# Patient Record
Sex: Female | Born: 1950
Health system: Southern US, Community
[De-identification: ages and names within clinical notes are randomized; demographics above are authoritative.]

## PROBLEM LIST (undated history)

## (undated) DIAGNOSIS — I10 Essential (primary) hypertension: Secondary | ICD-10-CM

## (undated) HISTORY — DX: Essential (primary) hypertension: I10

## (undated) HISTORY — PX: OOPHORECTOMY: SHX86

## (undated) HISTORY — PX: HEMORROIDECTOMY: SUR656

## (undated) HISTORY — PX: GASTRIC BYPASS: SHX52

## (undated) HISTORY — PX: APPENDECTOMY: SHX54

## (undated) HISTORY — PX: OVARIAN CYST SURGERY: SHX726

---

## 1998-01-06 ENCOUNTER — Other Ambulatory Visit: Admission: RE | Admit: 1998-01-06 | Discharge: 1998-01-06 | Payer: Self-pay | Admitting: Obstetrics and Gynecology

## 1999-03-31 ENCOUNTER — Other Ambulatory Visit: Admission: RE | Admit: 1999-03-31 | Discharge: 1999-03-31 | Payer: Self-pay | Admitting: Obstetrics and Gynecology

## 2000-02-26 ENCOUNTER — Emergency Department (HOSPITAL_COMMUNITY): Admission: EM | Admit: 2000-02-26 | Discharge: 2000-02-27 | Payer: Self-pay | Admitting: Emergency Medicine

## 2000-03-12 ENCOUNTER — Encounter: Payer: Self-pay | Admitting: General Surgery

## 2000-03-12 ENCOUNTER — Encounter: Admission: RE | Admit: 2000-03-12 | Discharge: 2000-03-12 | Payer: Self-pay | Admitting: General Surgery

## 2000-03-14 ENCOUNTER — Ambulatory Visit (HOSPITAL_BASED_OUTPATIENT_CLINIC_OR_DEPARTMENT_OTHER): Admission: RE | Admit: 2000-03-14 | Discharge: 2000-03-14 | Payer: Self-pay | Admitting: General Surgery

## 2000-03-14 ENCOUNTER — Encounter (INDEPENDENT_AMBULATORY_CARE_PROVIDER_SITE_OTHER): Payer: Self-pay | Admitting: *Deleted

## 2000-03-17 ENCOUNTER — Emergency Department (HOSPITAL_COMMUNITY): Admission: EM | Admit: 2000-03-17 | Discharge: 2000-03-17 | Payer: Self-pay | Admitting: Emergency Medicine

## 2002-01-15 ENCOUNTER — Other Ambulatory Visit: Admission: RE | Admit: 2002-01-15 | Discharge: 2002-01-15 | Payer: Self-pay | Admitting: Internal Medicine

## 2003-07-21 ENCOUNTER — Encounter: Admission: RE | Admit: 2003-07-21 | Discharge: 2003-07-21 | Payer: Self-pay | Admitting: Internal Medicine

## 2004-07-21 ENCOUNTER — Encounter: Admission: RE | Admit: 2004-07-21 | Discharge: 2004-07-21 | Payer: Self-pay | Admitting: Internal Medicine

## 2006-06-25 ENCOUNTER — Encounter: Admission: RE | Admit: 2006-06-25 | Discharge: 2006-06-25 | Payer: Self-pay | Admitting: Internal Medicine

## 2006-06-28 ENCOUNTER — Other Ambulatory Visit: Admission: RE | Admit: 2006-06-28 | Discharge: 2006-06-28 | Payer: Self-pay | Admitting: Internal Medicine

## 2007-06-27 ENCOUNTER — Encounter: Admission: RE | Admit: 2007-06-27 | Discharge: 2007-06-27 | Payer: Self-pay | Admitting: *Deleted

## 2008-06-30 ENCOUNTER — Encounter: Admission: RE | Admit: 2008-06-30 | Discharge: 2008-06-30 | Payer: Self-pay | Admitting: *Deleted

## 2009-07-01 ENCOUNTER — Encounter: Admission: RE | Admit: 2009-07-01 | Discharge: 2009-07-01 | Payer: Self-pay | Admitting: Family Medicine

## 2010-07-04 ENCOUNTER — Encounter: Admission: RE | Admit: 2010-07-04 | Discharge: 2010-07-04 | Payer: Self-pay | Admitting: *Deleted

## 2011-01-23 ENCOUNTER — Other Ambulatory Visit: Payer: Self-pay | Admitting: Physician Assistant

## 2011-01-23 ENCOUNTER — Other Ambulatory Visit (HOSPITAL_COMMUNITY)
Admission: RE | Admit: 2011-01-23 | Discharge: 2011-01-23 | Disposition: A | Discharge status: Home / Self Care | Payer: Managed Care, Other (non HMO) | Source: Ambulatory Visit | Attending: Internal Medicine | Admitting: Internal Medicine

## 2011-01-23 DIAGNOSIS — Z01419 Encounter for gynecological examination (general) (routine) without abnormal findings: Secondary | ICD-10-CM | POA: Insufficient documentation

## 2011-02-10 NOTE — Op Note (Signed)
[place]. [facility]  Patient:    Desiree James, Desiree James                    MRN: [id number] Proc. Date: 03/14/00 Adm. Date:  [id number] Disc. Date: [id number] Attending:  Cherylynn Ridges                           Operative Report  PREOPERATIVE DIAGNOSIS:  Severe internal and external hemorrhoids with 12 oclock large external skin tags.  POSTOPERATIVE DIAGNOSIS:  Severe internal and external hemorrhoids with 12 oclock large external skin tags.  PROCEDURE:  Internal and external hemorrhoidectomy x 3.  SURGEON:  Jimmye Norman, M.D.  ASSISTANT:  None.  ANESTHESIA:  General with a laryngeal airway and 0.25% Marcaine with epinephrine.  ESTIMATED BLOOD LOSS:  50 cc.  COMPLICATIONS:  None.  CONDITION:  Stable.  INDICATION FOR OPERATION:  The patient is a 60 year old with severe hemorrhoids that have been very symptomatic recently, who comes in now for internal and external hemorrhoidectomy.  FINDINGS:  The patient had large hemorrhoids internal and external at the 12 oclock position, approximately the 9 oclock position and the 4 oclock position with 12 oclock being directly anterior towards the pubis.  There was no thrombosis.  OPERATION:  The patient was taken to the operating room, placed on table in supine position.  After an adequate general laryngeal airway anesthetic was administered, she was prepped and draped in usual sterile manner in the lithotomy position.  Her gluteal cheeks were spread apart using benzoin spray and adhesive tape. Initially, the hemorrhoids at the 9 oclock position and the 4 oclock position were excised.  Marcaine 0.25% with epinephrine was injected into the submucosal plane.  The base of the hemorrhoid at the 9 oclock position was grasped using an Allis clamp and a 3-0 chromic suture placed at the base using a simple tie.  We subsequently excised the hemorrhoid with 15 blade using electrocautery to attain hemostasis in the  mucosal bed.   We ran the chromic stitch from the base, where it had been placed in a locking manner out to the skin level, where there was redundant tissue on the right side or at the 9 oclock position.  This additional tissue was excised and then a new suture using 3-0 chromic locking suture.  Once this was done, hemorrhoids at both the 4 oclock position and the 12 oclock position were removed in a similar manner attaining hemostasis using the locking stitches of 3-0 chromic and electrocautery.  All three were sent as specimens.  The patient still had a sphincter left intact, which could be seen circumferentially.  We placed a Gelfoam with dibucaine ointment into the rectum at the area of the excision of the hemorrhoids and then covered it with a sterile dressing.  All needle counts, sponge counts and instrument counts were correct. DD:  03/14/00 TD:  03/16/00 Job: [id number]

## 2011-07-03 ENCOUNTER — Other Ambulatory Visit: Payer: Self-pay | Admitting: Family Medicine

## 2011-07-03 DIAGNOSIS — Z1231 Encounter for screening mammogram for malignant neoplasm of breast: Secondary | ICD-10-CM

## 2011-07-10 ENCOUNTER — Ambulatory Visit
Admission: RE | Admit: 2011-07-10 | Discharge: 2011-07-10 | Disposition: A | Discharge status: Home / Self Care | Payer: Managed Care, Other (non HMO) | Source: Ambulatory Visit | Attending: Family Medicine | Admitting: Family Medicine

## 2011-07-10 DIAGNOSIS — Z1231 Encounter for screening mammogram for malignant neoplasm of breast: Secondary | ICD-10-CM

## 2012-07-18 ENCOUNTER — Other Ambulatory Visit: Payer: Self-pay | Admitting: Family Medicine

## 2012-07-18 DIAGNOSIS — Z1231 Encounter for screening mammogram for malignant neoplasm of breast: Secondary | ICD-10-CM

## 2012-07-22 ENCOUNTER — Ambulatory Visit
Admission: RE | Admit: 2012-07-22 | Discharge: 2012-07-22 | Disposition: A | Discharge status: Home / Self Care | Payer: No Typology Code available for payment source | Source: Ambulatory Visit | Attending: Family Medicine | Admitting: Family Medicine

## 2012-07-22 DIAGNOSIS — Z1231 Encounter for screening mammogram for malignant neoplasm of breast: Secondary | ICD-10-CM

## 2013-07-09 ENCOUNTER — Other Ambulatory Visit: Payer: Self-pay

## 2013-07-09 DIAGNOSIS — Z1231 Encounter for screening mammogram for malignant neoplasm of breast: Secondary | ICD-10-CM

## 2013-07-25 ENCOUNTER — Ambulatory Visit
Admission: RE | Admit: 2013-07-25 | Discharge: 2013-07-25 | Disposition: A | Discharge status: Home / Self Care | Payer: No Typology Code available for payment source | Source: Ambulatory Visit

## 2013-07-25 DIAGNOSIS — Z1231 Encounter for screening mammogram for malignant neoplasm of breast: Secondary | ICD-10-CM

## 2018-02-01 DIAGNOSIS — E668 Other obesity: Secondary | ICD-10-CM | POA: Diagnosis not present

## 2018-02-01 DIAGNOSIS — R002 Palpitations: Secondary | ICD-10-CM | POA: Diagnosis not present

## 2018-02-01 DIAGNOSIS — Z1389 Encounter for screening for other disorder: Secondary | ICD-10-CM | POA: Diagnosis not present

## 2018-02-01 DIAGNOSIS — M1990 Unspecified osteoarthritis, unspecified site: Secondary | ICD-10-CM | POA: Diagnosis not present

## 2018-02-01 DIAGNOSIS — Z980 Intestinal bypass and anastomosis status: Secondary | ICD-10-CM | POA: Diagnosis not present

## 2018-02-01 DIAGNOSIS — I10 Essential (primary) hypertension: Secondary | ICD-10-CM | POA: Diagnosis not present

## 2018-02-01 DIAGNOSIS — Z6838 Body mass index (BMI) 38.0-38.9, adult: Secondary | ICD-10-CM | POA: Diagnosis not present

## 2018-08-07 DIAGNOSIS — Z23 Encounter for immunization: Secondary | ICD-10-CM | POA: Diagnosis not present

## 2019-01-29 ENCOUNTER — Other Ambulatory Visit: Payer: Self-pay | Admitting: Endocrinology

## 2019-01-29 DIAGNOSIS — Z1231 Encounter for screening mammogram for malignant neoplasm of breast: Secondary | ICD-10-CM

## 2019-01-30 DIAGNOSIS — Z008 Encounter for other general examination: Secondary | ICD-10-CM | POA: Diagnosis not present

## 2019-02-04 DIAGNOSIS — I10 Essential (primary) hypertension: Secondary | ICD-10-CM | POA: Diagnosis not present

## 2019-02-05 DIAGNOSIS — R8290 Unspecified abnormal findings in urine: Secondary | ICD-10-CM | POA: Diagnosis not present

## 2019-02-05 DIAGNOSIS — I10 Essential (primary) hypertension: Secondary | ICD-10-CM | POA: Diagnosis not present

## 2019-02-11 DIAGNOSIS — R002 Palpitations: Secondary | ICD-10-CM | POA: Diagnosis not present

## 2019-02-11 DIAGNOSIS — I10 Essential (primary) hypertension: Secondary | ICD-10-CM | POA: Diagnosis not present

## 2019-02-11 DIAGNOSIS — E669 Obesity, unspecified: Secondary | ICD-10-CM | POA: Diagnosis not present

## 2019-02-11 DIAGNOSIS — Z980 Intestinal bypass and anastomosis status: Secondary | ICD-10-CM | POA: Diagnosis not present

## 2019-02-11 DIAGNOSIS — Z1331 Encounter for screening for depression: Secondary | ICD-10-CM | POA: Diagnosis not present

## 2019-02-11 DIAGNOSIS — Z1339 Encounter for screening examination for other mental health and behavioral disorders: Secondary | ICD-10-CM | POA: Diagnosis not present

## 2019-02-11 DIAGNOSIS — Z0000 Encounter for general adult medical examination without abnormal findings: Secondary | ICD-10-CM | POA: Diagnosis not present

## 2019-02-11 DIAGNOSIS — M1990 Unspecified osteoarthritis, unspecified site: Secondary | ICD-10-CM | POA: Diagnosis not present

## 2019-03-26 ENCOUNTER — Ambulatory Visit
Admission: RE | Admit: 2019-03-26 | Discharge: 2019-03-26 | Disposition: A | Discharge status: Home / Self Care | Payer: Medicare HMO | Source: Ambulatory Visit | Attending: Endocrinology | Admitting: Endocrinology

## 2019-03-26 ENCOUNTER — Other Ambulatory Visit: Payer: Self-pay

## 2019-03-26 DIAGNOSIS — Z1231 Encounter for screening mammogram for malignant neoplasm of breast: Secondary | ICD-10-CM | POA: Diagnosis not present

## 2019-04-12 DIAGNOSIS — R030 Elevated blood-pressure reading, without diagnosis of hypertension: Secondary | ICD-10-CM | POA: Diagnosis not present

## 2019-04-12 DIAGNOSIS — S6991XA Unspecified injury of right wrist, hand and finger(s), initial encounter: Secondary | ICD-10-CM | POA: Diagnosis not present

## 2019-06-15 DIAGNOSIS — R69 Illness, unspecified: Secondary | ICD-10-CM | POA: Diagnosis not present

## 2019-09-16 ENCOUNTER — Ambulatory Visit: Payer: Medicare HMO | Attending: Internal Medicine

## 2019-09-16 DIAGNOSIS — Z20822 Contact with and (suspected) exposure to covid-19: Secondary | ICD-10-CM

## 2019-09-16 DIAGNOSIS — Z20828 Contact with and (suspected) exposure to other viral communicable diseases: Secondary | ICD-10-CM | POA: Diagnosis not present

## 2019-09-18 LAB — NOVEL CORONAVIRUS, NAA: SARS-CoV-2, NAA: NOT DETECTED

## 2019-10-18 DIAGNOSIS — Z20828 Contact with and (suspected) exposure to other viral communicable diseases: Secondary | ICD-10-CM | POA: Diagnosis not present

## 2019-11-23 DIAGNOSIS — Z20828 Contact with and (suspected) exposure to other viral communicable diseases: Secondary | ICD-10-CM | POA: Diagnosis not present

## 2020-02-11 DIAGNOSIS — E669 Obesity, unspecified: Secondary | ICD-10-CM | POA: Diagnosis not present

## 2020-02-11 DIAGNOSIS — I10 Essential (primary) hypertension: Secondary | ICD-10-CM | POA: Diagnosis not present

## 2020-02-11 DIAGNOSIS — Z0000 Encounter for general adult medical examination without abnormal findings: Secondary | ICD-10-CM | POA: Diagnosis not present

## 2020-02-18 DIAGNOSIS — Z980 Intestinal bypass and anastomosis status: Secondary | ICD-10-CM | POA: Diagnosis not present

## 2020-02-18 DIAGNOSIS — E669 Obesity, unspecified: Secondary | ICD-10-CM | POA: Diagnosis not present

## 2020-02-18 DIAGNOSIS — R002 Palpitations: Secondary | ICD-10-CM | POA: Diagnosis not present

## 2020-02-18 DIAGNOSIS — M1990 Unspecified osteoarthritis, unspecified site: Secondary | ICD-10-CM | POA: Diagnosis not present

## 2020-02-18 DIAGNOSIS — I10 Essential (primary) hypertension: Secondary | ICD-10-CM | POA: Diagnosis not present

## 2020-02-18 DIAGNOSIS — Z1389 Encounter for screening for other disorder: Secondary | ICD-10-CM | POA: Diagnosis not present

## 2020-02-18 DIAGNOSIS — R69 Illness, unspecified: Secondary | ICD-10-CM | POA: Diagnosis not present

## 2020-02-20 DIAGNOSIS — R82998 Other abnormal findings in urine: Secondary | ICD-10-CM | POA: Diagnosis not present

## 2020-02-20 DIAGNOSIS — I10 Essential (primary) hypertension: Secondary | ICD-10-CM | POA: Diagnosis not present

## 2020-02-26 DIAGNOSIS — Z1212 Encounter for screening for malignant neoplasm of rectum: Secondary | ICD-10-CM | POA: Diagnosis not present

## 2020-03-20 DIAGNOSIS — M1990 Unspecified osteoarthritis, unspecified site: Secondary | ICD-10-CM | POA: Diagnosis not present

## 2020-03-20 DIAGNOSIS — Z823 Family history of stroke: Secondary | ICD-10-CM | POA: Diagnosis not present

## 2020-03-20 DIAGNOSIS — R030 Elevated blood-pressure reading, without diagnosis of hypertension: Secondary | ICD-10-CM | POA: Diagnosis not present

## 2020-03-20 DIAGNOSIS — Z6838 Body mass index (BMI) 38.0-38.9, adult: Secondary | ICD-10-CM | POA: Diagnosis not present

## 2020-03-20 DIAGNOSIS — Z833 Family history of diabetes mellitus: Secondary | ICD-10-CM | POA: Diagnosis not present

## 2020-03-20 DIAGNOSIS — R32 Unspecified urinary incontinence: Secondary | ICD-10-CM | POA: Diagnosis not present

## 2020-03-20 DIAGNOSIS — Z803 Family history of malignant neoplasm of breast: Secondary | ICD-10-CM | POA: Diagnosis not present

## 2020-03-20 DIAGNOSIS — E785 Hyperlipidemia, unspecified: Secondary | ICD-10-CM | POA: Diagnosis not present

## 2020-03-20 DIAGNOSIS — Z7982 Long term (current) use of aspirin: Secondary | ICD-10-CM | POA: Diagnosis not present

## 2020-03-30 DIAGNOSIS — Z1331 Encounter for screening for depression: Secondary | ICD-10-CM | POA: Diagnosis not present

## 2020-03-30 DIAGNOSIS — Z980 Intestinal bypass and anastomosis status: Secondary | ICD-10-CM | POA: Diagnosis not present

## 2020-03-30 DIAGNOSIS — I10 Essential (primary) hypertension: Secondary | ICD-10-CM | POA: Diagnosis not present

## 2020-03-30 DIAGNOSIS — R69 Illness, unspecified: Secondary | ICD-10-CM | POA: Diagnosis not present

## 2020-04-22 DIAGNOSIS — R69 Illness, unspecified: Secondary | ICD-10-CM | POA: Diagnosis not present

## 2020-06-25 ENCOUNTER — Other Ambulatory Visit: Payer: Self-pay | Admitting: Endocrinology

## 2020-06-25 DIAGNOSIS — Z1231 Encounter for screening mammogram for malignant neoplasm of breast: Secondary | ICD-10-CM

## 2020-08-23 ENCOUNTER — Ambulatory Visit
Admission: RE | Admit: 2020-08-23 | Discharge: 2020-08-23 | Disposition: A | Discharge status: Home / Self Care | Payer: Medicare HMO | Source: Ambulatory Visit | Attending: Endocrinology | Admitting: Endocrinology

## 2020-08-23 ENCOUNTER — Other Ambulatory Visit: Payer: Self-pay

## 2020-08-23 DIAGNOSIS — Z1231 Encounter for screening mammogram for malignant neoplasm of breast: Secondary | ICD-10-CM | POA: Diagnosis not present

## 2020-09-30 DIAGNOSIS — M1990 Unspecified osteoarthritis, unspecified site: Secondary | ICD-10-CM | POA: Diagnosis not present

## 2020-09-30 DIAGNOSIS — Z980 Intestinal bypass and anastomosis status: Secondary | ICD-10-CM | POA: Diagnosis not present

## 2020-09-30 DIAGNOSIS — E669 Obesity, unspecified: Secondary | ICD-10-CM | POA: Diagnosis not present

## 2020-09-30 DIAGNOSIS — R69 Illness, unspecified: Secondary | ICD-10-CM | POA: Diagnosis not present

## 2020-09-30 DIAGNOSIS — Z7689 Persons encountering health services in other specified circumstances: Secondary | ICD-10-CM | POA: Diagnosis not present

## 2020-09-30 DIAGNOSIS — I10 Essential (primary) hypertension: Secondary | ICD-10-CM | POA: Diagnosis not present

## 2020-09-30 DIAGNOSIS — R001 Bradycardia, unspecified: Secondary | ICD-10-CM | POA: Diagnosis not present

## 2020-10-06 ENCOUNTER — Other Ambulatory Visit (HOSPITAL_COMMUNITY): Payer: Self-pay | Admitting: Endocrinology

## 2020-10-06 DIAGNOSIS — R001 Bradycardia, unspecified: Secondary | ICD-10-CM

## 2020-10-28 ENCOUNTER — Other Ambulatory Visit: Payer: Self-pay

## 2020-10-28 ENCOUNTER — Ambulatory Visit (HOSPITAL_COMMUNITY): Payer: Medicare HMO | Attending: Cardiology

## 2020-10-28 DIAGNOSIS — R001 Bradycardia, unspecified: Secondary | ICD-10-CM

## 2020-10-28 LAB — ECHOCARDIOGRAM COMPLETE
Area-P 1/2: 3.3 cm2
S' Lateral: 2.9 cm

## 2021-02-11 DIAGNOSIS — I10 Essential (primary) hypertension: Secondary | ICD-10-CM | POA: Diagnosis not present

## 2021-02-16 ENCOUNTER — Encounter (INDEPENDENT_AMBULATORY_CARE_PROVIDER_SITE_OTHER): Payer: Self-pay

## 2021-02-18 DIAGNOSIS — K921 Melena: Secondary | ICD-10-CM | POA: Diagnosis not present

## 2021-02-18 DIAGNOSIS — Z0000 Encounter for general adult medical examination without abnormal findings: Secondary | ICD-10-CM | POA: Diagnosis not present

## 2021-02-18 DIAGNOSIS — I5189 Other ill-defined heart diseases: Secondary | ICD-10-CM | POA: Diagnosis not present

## 2021-02-18 DIAGNOSIS — E669 Obesity, unspecified: Secondary | ICD-10-CM | POA: Diagnosis not present

## 2021-02-18 DIAGNOSIS — Z980 Intestinal bypass and anastomosis status: Secondary | ICD-10-CM | POA: Diagnosis not present

## 2021-02-18 DIAGNOSIS — I10 Essential (primary) hypertension: Secondary | ICD-10-CM | POA: Diagnosis not present

## 2021-02-18 DIAGNOSIS — R69 Illness, unspecified: Secondary | ICD-10-CM | POA: Diagnosis not present

## 2021-02-18 DIAGNOSIS — R82998 Other abnormal findings in urine: Secondary | ICD-10-CM | POA: Diagnosis not present

## 2021-04-28 DIAGNOSIS — K921 Melena: Secondary | ICD-10-CM | POA: Diagnosis not present

## 2021-04-28 DIAGNOSIS — Z1212 Encounter for screening for malignant neoplasm of rectum: Secondary | ICD-10-CM | POA: Diagnosis not present

## 2021-05-06 DIAGNOSIS — Z9884 Bariatric surgery status: Secondary | ICD-10-CM | POA: Diagnosis not present

## 2021-05-06 DIAGNOSIS — R195 Other fecal abnormalities: Secondary | ICD-10-CM | POA: Diagnosis not present

## 2021-05-16 DIAGNOSIS — R195 Other fecal abnormalities: Secondary | ICD-10-CM | POA: Diagnosis not present

## 2021-06-29 DIAGNOSIS — I10 Essential (primary) hypertension: Secondary | ICD-10-CM | POA: Diagnosis not present

## 2021-06-29 DIAGNOSIS — E785 Hyperlipidemia, unspecified: Secondary | ICD-10-CM | POA: Diagnosis not present

## 2021-06-29 DIAGNOSIS — Z823 Family history of stroke: Secondary | ICD-10-CM | POA: Diagnosis not present

## 2021-06-29 DIAGNOSIS — Z008 Encounter for other general examination: Secondary | ICD-10-CM | POA: Diagnosis not present

## 2021-06-29 DIAGNOSIS — R69 Illness, unspecified: Secondary | ICD-10-CM | POA: Diagnosis not present

## 2021-06-29 DIAGNOSIS — F419 Anxiety disorder, unspecified: Secondary | ICD-10-CM | POA: Diagnosis not present

## 2021-06-29 DIAGNOSIS — R32 Unspecified urinary incontinence: Secondary | ICD-10-CM | POA: Diagnosis not present

## 2021-06-29 DIAGNOSIS — Z833 Family history of diabetes mellitus: Secondary | ICD-10-CM | POA: Diagnosis not present

## 2021-06-29 DIAGNOSIS — F325 Major depressive disorder, single episode, in full remission: Secondary | ICD-10-CM | POA: Diagnosis not present

## 2021-06-29 DIAGNOSIS — Z7982 Long term (current) use of aspirin: Secondary | ICD-10-CM | POA: Diagnosis not present

## 2021-06-29 DIAGNOSIS — Z803 Family history of malignant neoplasm of breast: Secondary | ICD-10-CM | POA: Diagnosis not present

## 2021-06-29 DIAGNOSIS — Z6838 Body mass index (BMI) 38.0-38.9, adult: Secondary | ICD-10-CM | POA: Diagnosis not present

## 2021-07-18 DIAGNOSIS — I10 Essential (primary) hypertension: Secondary | ICD-10-CM | POA: Diagnosis not present

## 2021-07-18 DIAGNOSIS — E669 Obesity, unspecified: Secondary | ICD-10-CM | POA: Diagnosis not present

## 2021-07-18 DIAGNOSIS — Z980 Intestinal bypass and anastomosis status: Secondary | ICD-10-CM | POA: Diagnosis not present

## 2021-07-18 DIAGNOSIS — I5189 Other ill-defined heart diseases: Secondary | ICD-10-CM | POA: Diagnosis not present

## 2021-07-18 DIAGNOSIS — R69 Illness, unspecified: Secondary | ICD-10-CM | POA: Diagnosis not present

## 2021-09-04 IMAGING — MG DIGITAL SCREENING BILAT W/ TOMO W/ CAD
8 series · 8 of 24 positions shown · non-contrast
Comparison: Previous exam(s).

CLINICAL DATA: Screening.

EXAM:
DIGITAL SCREENING BILATERAL MAMMOGRAM WITH TOMO AND CAD

[L CC synth-2D]
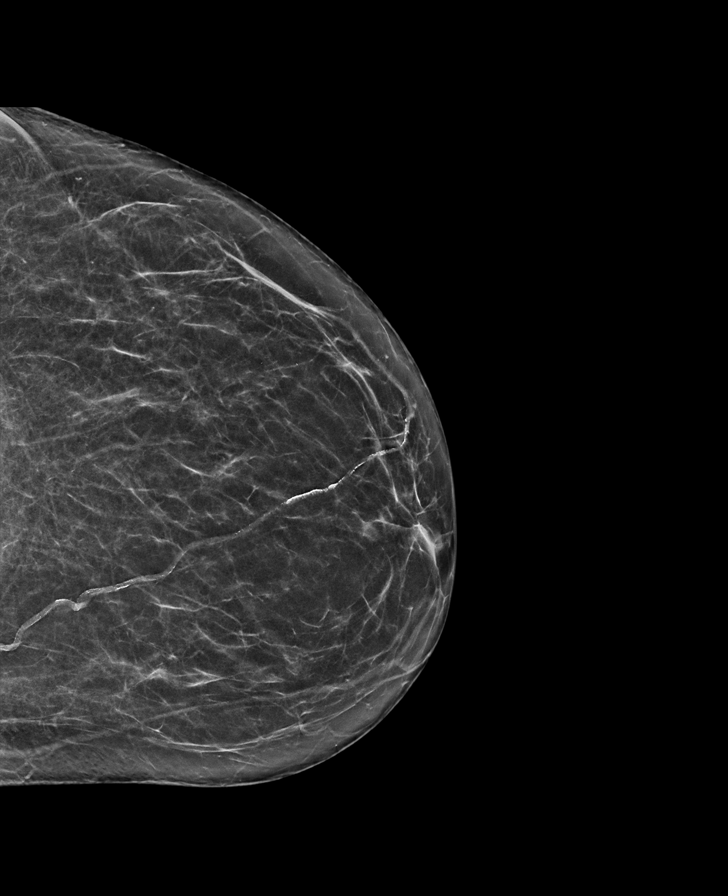

[L MLO synth-2D]
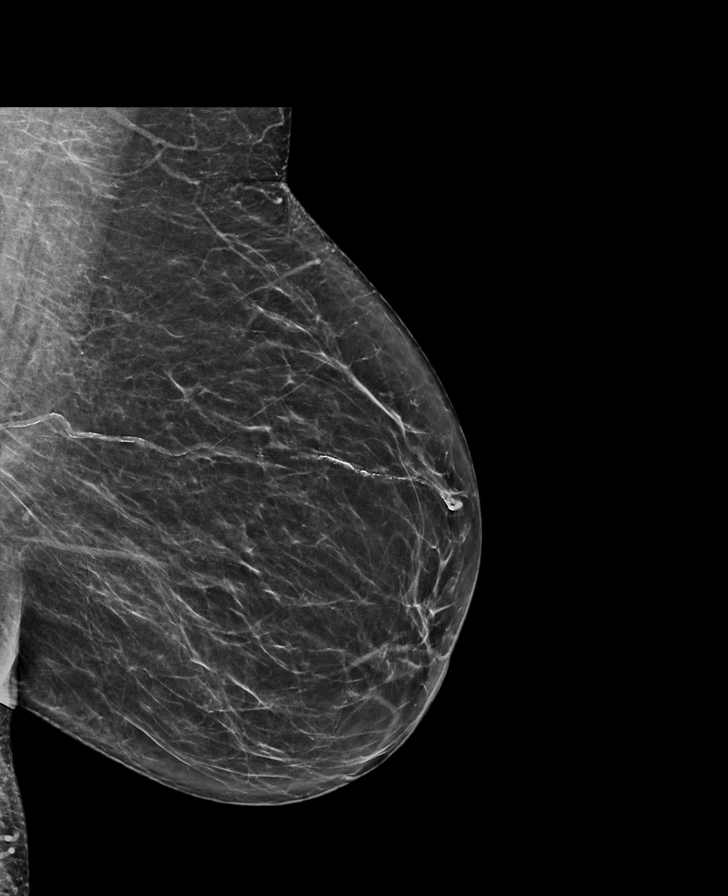

[R CC synth-2D]
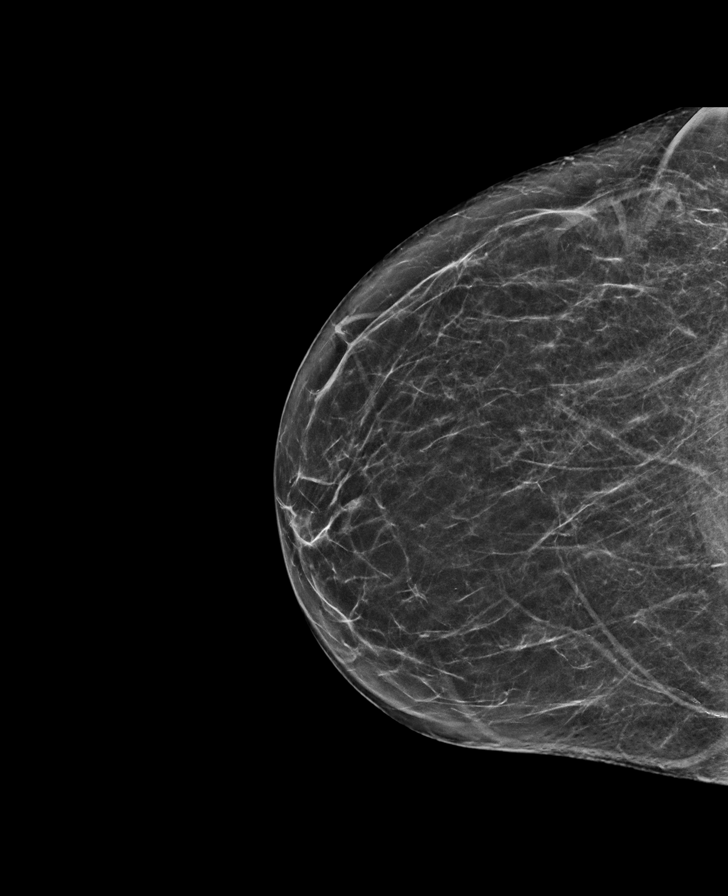

[R MLO synth-2D]
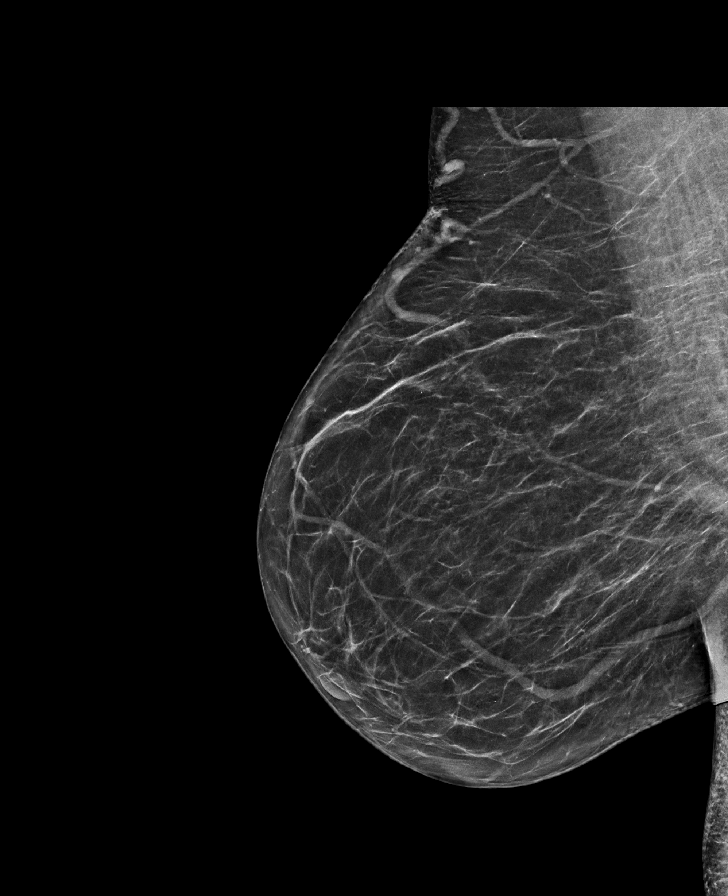

[L MLO tomo · tomo slice 35/69.0]
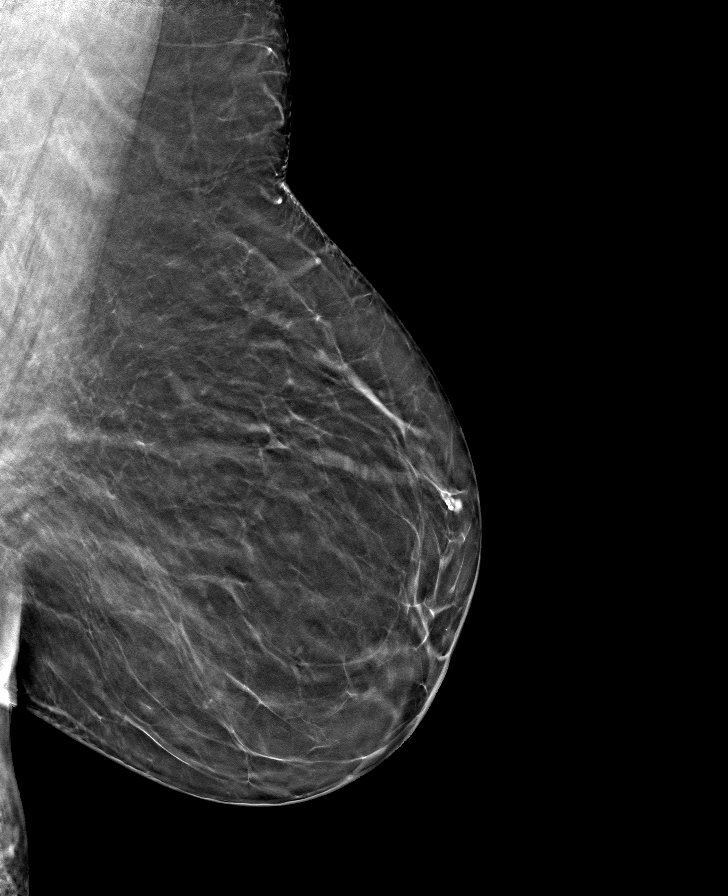

[R MLO tomo · tomo slice 35/68.0]
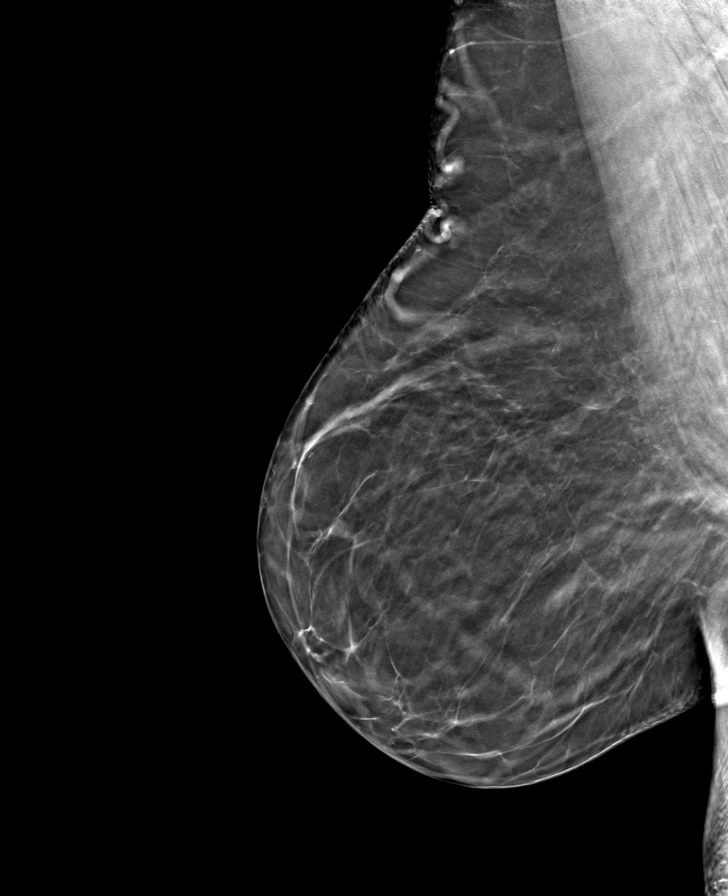

[L CC tomo · tomo slice 31/62.0]
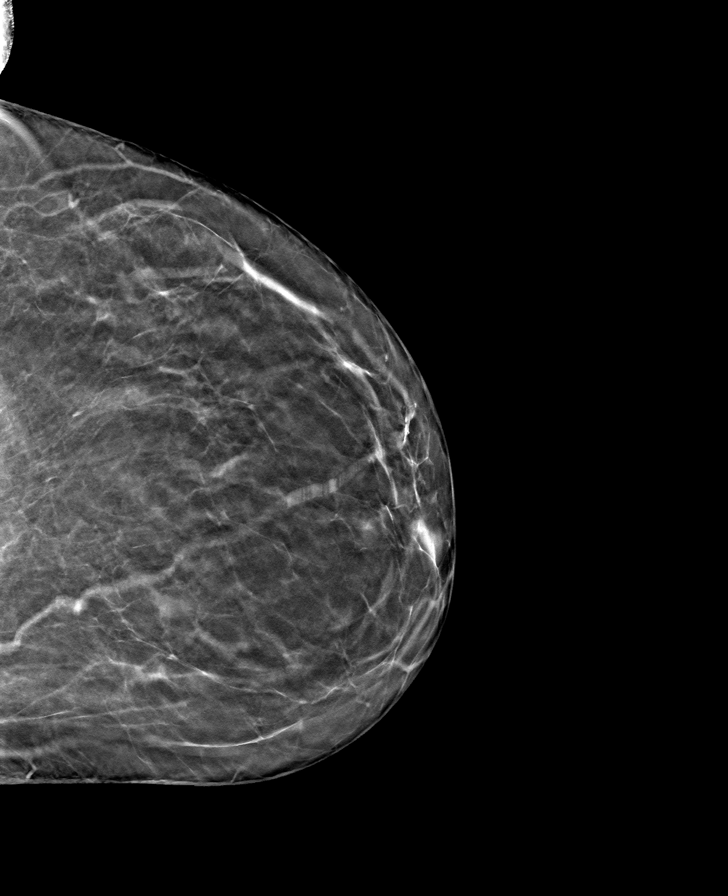

[R CC tomo · tomo slice 31/60.0]
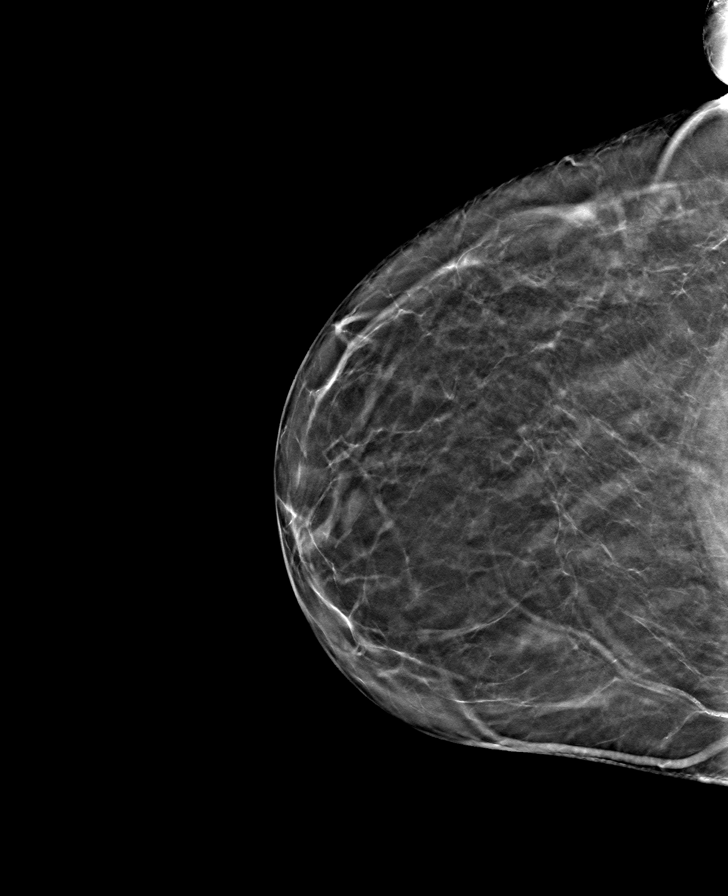

[8 of 24 positions shown; findings below may reference images not displayed]

ACR Breast Density Category b: There are scattered areas of
fibroglandular density.
FINDINGS: There are no findings suspicious for malignancy. Images were
processed with CAD.
IMPRESSION: No mammographic evidence of malignancy. A result letter of this
screening mammogram will be mailed directly to the patient.

RECOMMENDATION:
Screening mammogram in one year. (Code:[id number])

BI-RADS CATEGORY  1: Negative.

## 2021-09-26 ENCOUNTER — Encounter (INDEPENDENT_AMBULATORY_CARE_PROVIDER_SITE_OTHER): Payer: Self-pay

## 2022-01-24 DIAGNOSIS — I10 Essential (primary) hypertension: Secondary | ICD-10-CM | POA: Diagnosis not present

## 2022-01-24 DIAGNOSIS — Z0000 Encounter for general adult medical examination without abnormal findings: Secondary | ICD-10-CM | POA: Diagnosis not present

## 2022-01-24 DIAGNOSIS — R7989 Other specified abnormal findings of blood chemistry: Secondary | ICD-10-CM | POA: Diagnosis not present

## 2022-01-24 DIAGNOSIS — Z79899 Other long term (current) drug therapy: Secondary | ICD-10-CM | POA: Diagnosis not present

## 2022-01-31 DIAGNOSIS — F331 Major depressive disorder, recurrent, moderate: Secondary | ICD-10-CM | POA: Diagnosis not present

## 2022-01-31 DIAGNOSIS — R001 Bradycardia, unspecified: Secondary | ICD-10-CM | POA: Diagnosis not present

## 2022-01-31 DIAGNOSIS — E669 Obesity, unspecified: Secondary | ICD-10-CM | POA: Diagnosis not present

## 2022-01-31 DIAGNOSIS — Z1331 Encounter for screening for depression: Secondary | ICD-10-CM | POA: Diagnosis not present

## 2022-01-31 DIAGNOSIS — I5189 Other ill-defined heart diseases: Secondary | ICD-10-CM | POA: Diagnosis not present

## 2022-01-31 DIAGNOSIS — F418 Other specified anxiety disorders: Secondary | ICD-10-CM | POA: Diagnosis not present

## 2022-01-31 DIAGNOSIS — I10 Essential (primary) hypertension: Secondary | ICD-10-CM | POA: Diagnosis not present

## 2022-01-31 DIAGNOSIS — Z0000 Encounter for general adult medical examination without abnormal findings: Secondary | ICD-10-CM | POA: Diagnosis not present

## 2022-01-31 DIAGNOSIS — R82998 Other abnormal findings in urine: Secondary | ICD-10-CM | POA: Diagnosis not present

## 2022-01-31 DIAGNOSIS — Z980 Intestinal bypass and anastomosis status: Secondary | ICD-10-CM | POA: Diagnosis not present

## 2022-02-01 ENCOUNTER — Encounter: Payer: Self-pay | Admitting: Student

## 2022-02-01 ENCOUNTER — Ambulatory Visit: Payer: Medicare HMO | Admitting: Student

## 2022-02-01 ENCOUNTER — Inpatient Hospital Stay: Payer: Medicare HMO

## 2022-02-01 VITALS — BP 145/78 | HR 39 | Temp 98.1°F | Resp 17 | Ht 64.0 in | Wt 228.8 lb

## 2022-02-01 DIAGNOSIS — R0609 Other forms of dyspnea: Secondary | ICD-10-CM | POA: Diagnosis not present

## 2022-02-01 DIAGNOSIS — R5383 Other fatigue: Secondary | ICD-10-CM

## 2022-02-01 DIAGNOSIS — R001 Bradycardia, unspecified: Secondary | ICD-10-CM | POA: Diagnosis not present

## 2022-02-01 DIAGNOSIS — R0683 Snoring: Secondary | ICD-10-CM | POA: Diagnosis not present

## 2022-02-01 DIAGNOSIS — I495 Sick sinus syndrome: Secondary | ICD-10-CM

## 2022-02-01 DIAGNOSIS — I498 Other specified cardiac arrhythmias: Secondary | ICD-10-CM

## 2022-02-01 DIAGNOSIS — R9431 Abnormal electrocardiogram [ECG] [EKG]: Secondary | ICD-10-CM

## 2022-02-01 NOTE — Progress Notes (Signed)
? ?Primary Physician/Referring:  Adrian PrinceSouth, Stephen, MD ? ?Patient ID: Desiree BrochureMarilyn W James, female    DOB: 03/21/1951, 71 y.o.   MRN: [id number] ? ?Chief Complaint  ?Patient presents with  ? New Patient (Initial Visit)  ? Bradycardia  ? ?HPI:   ? ?Desiree James  is a 71 y.o. Caucasian female with history of hypertension, hyperlipidemia, anxiety.  Denies history of diabetes, tobacco use, MI, CVA/TIA.  Patient was originally referred to our office by PCP given marked bradycardia noted on PCP visit. ? ?Patient reports ongoing general fatigue since February 2023.  She does walk her dog 1-2 times daily for 15 to 20 minutes each time without issue.  She does note mild dyspnea on exertion when walking uphill and occasional episodes of lightheadedness when bending over or standing for long periods of time.  Denies palpitations, syncope, near syncope.  Denies orthopnea, PND.  Patient does snore.  She reports she was tested for sleep apnea several years ago and did not have sleep apnea at this time, however she notes this was a long time ago. ? ?Upon further questioning patient states that for the last few months she has been taking Ativan 0.5 grams twice daily along with sertraline, both of these medications are managed by her PCP Dr. Evlyn KannerSouth. ? ?Past Medical History:  ?Diagnosis Date  ? Hypertension   ? ?Past Surgical History:  ?Procedure Laterality Date  ? APPENDECTOMY    ? GASTRIC BYPASS    ? HEMORROIDECTOMY    ? OOPHORECTOMY    ? OVARIAN CYST SURGERY    ? ?Family History  ?Problem Relation Age of Onset  ? Stroke Mother   ? Stroke Father   ? Pneumonia Father   ? Breast cancer Sister   ? Pneumonia Brother   ? Cancer Brother   ?  ?Social History  ? ?Tobacco Use  ? Smoking status: Never  ? Smokeless tobacco: Never  ?Substance Use Topics  ? Alcohol use: Yes  ?  Comment: occ  ? ?Marital Status: Married  ? ?ROS  ?Review of Systems  ?Constitutional: Positive for malaise/fatigue. Negative for weight gain.  ?Cardiovascular:  Positive  for dyspnea on exertion. Negative for chest pain, claudication, leg swelling, near-syncope, orthopnea, palpitations, paroxysmal nocturnal dyspnea and syncope.  ?Respiratory:  Positive for snoring.   ? ?Objective  ?Blood pressure (!) 145/78, pulse (!) 39, temperature 98.1 ?F (36.7 ?C), temperature source Temporal, resp. rate 17, height 5\' 4"  (1.626 m), weight 228 lb 12.8 oz (103.8 kg), SpO2 95 %.  ? ?  02/01/2022  ?  9:22 AM  ?Vitals with BMI  ?Height 5\' 4"   ?Weight 228 lbs 13 oz  ?BMI 39.25  ?Systolic 145  ?Diastolic 78  ?Pulse 39  ?  ? ? Physical Exam ?Vitals reviewed.  ?Constitutional:   ?   Appearance: She is obese.  ?HENT:  ?   Head: Normocephalic and atraumatic.  ?Cardiovascular:  ?   Rate and Rhythm: Normal rate and regular rhythm.  ?   Pulses: Intact distal pulses.  ?   Heart sounds: S1 normal and S2 normal. No murmur heard. ?  No gallop.  ?Pulmonary:  ?   Effort: Pulmonary effort is normal. No respiratory distress.  ?   Breath sounds: Rhonchi (bilaterally) present. No wheezing or rales.  ?Musculoskeletal:  ?   Right lower leg: No edema.  ?   Left lower leg: No edema.  ?Neurological:  ?   Mental Status: She is alert.  ? ? ?Laboratory  examination:  ? ?No results for input(s): NA, K, CL, CO2, GLUCOSE, BUN, CREATININE, CALCIUM, GFRNONAA, GFRAA in the last 8760 hours. ?CrCl cannot be calculated (No successful lab value found.).  ?   ? View : No data to display.  ?  ?  ?  ? ?   ? View : No data to display.  ?  ?  ?  ? ? ?Lipid Panel ?No results for input(s): CHOL, TRIG, LDLCALC, VLDL, HDL, CHOLHDL, LDLDIRECT in the last 8760 hours. ? ?HEMOGLOBIN A1C ?No results found for: HGBA1C, MPG ?TSH ?No results for input(s): TSH in the last 8760 hours. ? ?External labs:  ?None - have requested PCP labs  ? ?Allergies  ? ?Allergies  ?Allergen Reactions  ? Morphine Rash  ?  ?Medications Prior to Visit:  ? ?Outpatient Medications Prior to Visit  ?Medication Sig Dispense Refill  ? aspirin 81 MG EC tablet Take 1 tablet by mouth  daily.    ? LORazepam (ATIVAN) 0.5 MG tablet Take 0.5 mg by mouth 2 (two) times daily as needed.    ? losartan (COZAAR) 50 MG tablet Take 50 mg by mouth 2 (two) times daily.    ? Multiple Vitamin (MULTI VITAMIN) TABS Take 1 tablet by mouth daily.    ? Omega-3 Fatty Acids (FISH OIL) 1000 MG CAPS Take 1 capsule by mouth daily.    ? rosuvastatin (CRESTOR) 10 MG tablet Take 10 mg by mouth 2 (two) times a week.    ? sertraline (ZOLOFT) 50 MG tablet Take 50 mg by mouth daily.    ? ?No facility-administered medications prior to visit.  ? ?Final Medications at End of Visit   ? ?Current Meds  ?Medication Sig  ? aspirin 81 MG EC tablet Take 1 tablet by mouth daily.  ? LORazepam (ATIVAN) 0.5 MG tablet Take 0.5 mg by mouth 2 (two) times daily as needed.  ? losartan (COZAAR) 50 MG tablet Take 50 mg by mouth 2 (two) times daily.  ? Multiple Vitamin (MULTI VITAMIN) TABS Take 1 tablet by mouth daily.  ? Omega-3 Fatty Acids (FISH OIL) 1000 MG CAPS Take 1 capsule by mouth daily.  ? rosuvastatin (CRESTOR) 10 MG tablet Take 10 mg by mouth 2 (two) times a week.  ? sertraline (ZOLOFT) 50 MG tablet Take 50 mg by mouth daily.  ? ?Radiology:  ? ?No results found. ? ?Cardiac Studies:  ? ?Echocardiogram 10/28/2020: ? 1. Left ventricular ejection fraction, by estimation, is 55 to 60%. Left  ?ventricular ejection fraction by 3D volume is 56 %. The left ventricle has  ?normal function. The left ventricle has no regional wall motion  ?abnormalities. Left ventricular diastolic  ? parameters are consistent with Grade II diastolic dysfunction  ?(pseudonormalization).  ? 2. Right ventricular systolic function is normal. The right ventricular  ?size is normal. There is normal pulmonary artery systolic pressure.  ? 3. Left atrial size was severely dilated.  ? 4. Right atrial size was mild to moderately dilated.  ? 5. The mitral valve is normal in structure. Mild mitral valve  ?regurgitation. No evidence of mitral stenosis.  ? 6. The aortic valve is  tricuspid. Aortic valve regurgitation is not  ?visualized. No aortic stenosis is present.  ? 7. Mildly dilated pulmonary artery.  ? 8. The inferior vena cava is normal in size with greater than 50%  ?respiratory variability, suggesting right atrial pressure of 3 mmHg.  ? ?EKG:  ? ?02/01/2022: Marked sinus bradycardia at a rate of 36  bpm.  Normal axis.  Poor R wave progression, cannot exclude anteroseptal infarct old.  Nonspecific T wave abnormality.  No evidence of ischemia or underlying injury pattern. ? ?Assessment  ? ?  ICD-10-CM   ?1. Tachycardia-bradycardia syndrome (HCC)  I49.5 EKG 12-Lead  ?  ?2. Abnormal electrocardiogram  R94.31 PCV MYOCARDIAL PERFUSION WO LEXISCAN  ?  ?3. Sinus bradycardia  R00.1 Ambulatory referral to Sleep Studies  ?  PCV ECHOCARDIOGRAM COMPLETE  ?  LONG TERM MONITOR (3-14 DAYS)  ?  ?4. Snoring  R06.83 Ambulatory referral to Sleep Studies  ?  ?5. Other fatigue  R53.83 Ambulatory referral to Sleep Studies  ?  LONG TERM MONITOR (3-14 DAYS)  ?  ?6. DOE (dyspnea on exertion)  R06.09 PCV ECHOCARDIOGRAM COMPLETE  ?  PCV MYOCARDIAL PERFUSION WO LEXISCAN  ?  ?  ? ?There are no discontinued medications.  ?No orders of the defined types were placed in this encounter. ? ? ?Recommendations:  ? ?BAILEA BEED is a 71 y.o.  Caucasian female with history of hypertension, hyperlipidemia, anxiety.  Denies history of diabetes, tobacco use, MI, CVA/TIA.  Patient was originally referred to our office by PCP given marked bradycardia noted on PCP visit. ? ?Patient is fairly asymptomatic in regard to bradycardia, with her only specific complaint being generalized fatigue over the last few months.  Patient walked in the hallway of our office with starting heart rate 39 bpm, ending heart rate 57 bpm, maximum heart rate 63 bpm.  Patient was asymptomatic during this walk she walks on a daily basis at home without issue.  Discussed at length with patient evaluating for reversible causes of bradycardia.  Patient  is presently taking Ativan twice daily every day, advised her to discuss with alternative anxiety management options as Ativan may be contributing to bradycardia. ? ?I have requested PCP labs, specifically thyr

## 2022-02-02 ENCOUNTER — Ambulatory Visit: Payer: Medicare HMO

## 2022-02-02 DIAGNOSIS — R001 Bradycardia, unspecified: Secondary | ICD-10-CM | POA: Diagnosis not present

## 2022-02-02 DIAGNOSIS — R0609 Other forms of dyspnea: Secondary | ICD-10-CM

## 2022-02-13 ENCOUNTER — Ambulatory Visit: Payer: Medicare HMO

## 2022-02-13 DIAGNOSIS — R9431 Abnormal electrocardiogram [ECG] [EKG]: Secondary | ICD-10-CM

## 2022-02-13 DIAGNOSIS — R001 Bradycardia, unspecified: Secondary | ICD-10-CM | POA: Diagnosis not present

## 2022-02-13 DIAGNOSIS — R5383 Other fatigue: Secondary | ICD-10-CM | POA: Diagnosis not present

## 2022-02-13 DIAGNOSIS — R0609 Other forms of dyspnea: Secondary | ICD-10-CM

## 2022-02-14 ENCOUNTER — Telehealth: Payer: Self-pay | Admitting: Student

## 2022-02-14 NOTE — Telephone Encounter (Signed)
[place] with [employer] calling to provide abnormal results for this patient. Please call her at [phone].

## 2022-02-14 NOTE — Telephone Encounter (Signed)
[place] with [employer] called back to give results.   Symptomatic bradycardia recorded on strip 2 @ 32 BPM.  Vtac and SVT on final strip as well as 2 episodes of pauses, with longest pause @ 3.6 seconds.

## 2022-02-15 DIAGNOSIS — R001 Bradycardia, unspecified: Secondary | ICD-10-CM | POA: Diagnosis not present

## 2022-02-15 DIAGNOSIS — R5383 Other fatigue: Secondary | ICD-10-CM | POA: Diagnosis not present

## 2022-02-15 NOTE — Progress Notes (Signed)
Reviewed and discussed results of monitor with patient.  She remains asymptomatic and in fact patient triggered events for accidental due to itching around the monitor site.  Patient's monitor findings are suggestive of tachybradycardia syndrome which we discussed at last office visit.  We will refer patient to EP for further evaluation as she is asymptomatic.

## 2022-02-15 NOTE — Addendum Note (Signed)
Addended by: Trula Ore on: 02/15/2022 02:54 PM   Modules accepted: Orders

## 2022-02-15 NOTE — Telephone Encounter (Signed)
I reviewed monitor and discussed results with patient.

## 2022-03-07 ENCOUNTER — Encounter: Payer: Self-pay | Admitting: Student

## 2022-03-14 NOTE — Progress Notes (Unsigned)
Primary Physician/Referring:  Adrian Prince, MD  Patient ID: Desiree James, female    DOB: 1951-07-20, 71 y.o.   MRN: [id number]  No chief complaint on file.  HPI:    Desiree James  is a 71 y.o. Caucasian female with history of hypertension, hyperlipidemia, anxiety.  Denies history of diabetes, tobacco use, MI, CVA/TIA.  Patient was originally referred to our office by PCP given marked bradycardia noted on PCP visit.  Patient was last seen in our office 02/01/2022 at which time ordered cardiac monitor and referred to sleep medicine, ordered echocardiogram and stress test. Echocardiogram revealed preserved LVEF, mild and moderate valvular disease as well as mild pulmonary hypertension. Stress test was low risk. Cardiac monitor finding suggestive of tachy-brady syndrome and patient was referred to EP for further recommendations.   Patient now presents for follow up. ***  ***  Patient reports ongoing general fatigue since February 2023.  She does walk her dog 1-2 times daily for 15 to 20 minutes each time without issue.  She does note mild dyspnea on exertion when walking uphill and occasional episodes of lightheadedness when bending over or standing for long periods of time.  Denies palpitations, syncope, near syncope.  Denies orthopnea, PND.  Patient does snore.  She reports she was tested for sleep apnea several years ago and did not have sleep apnea at this time, however she notes this was a long time ago.  Upon further questioning patient states that for the last few months she has been taking Ativan 0.5 grams twice daily along with sertraline, both of these medications are managed by her PCP Dr. Evlyn Kanner.  Past Medical History:  Diagnosis Date   Hypertension    Past Surgical History:  Procedure Laterality Date   APPENDECTOMY     GASTRIC BYPASS     HEMORROIDECTOMY     OOPHORECTOMY     OVARIAN CYST SURGERY     Family History  Problem Relation Age of Onset   Stroke Mother     Stroke Father    Pneumonia Father    Breast cancer Sister    Pneumonia Brother    Cancer Brother     Social History   Tobacco Use   Smoking status: Never   Smokeless tobacco: Never  Substance Use Topics   Alcohol use: Yes    Comment: occ   Marital Status: Married   ROS  Review of Systems  Constitutional: Positive for malaise/fatigue. Negative for weight gain.  Cardiovascular:  Positive for dyspnea on exertion. Negative for chest pain, claudication, leg swelling, near-syncope, orthopnea, palpitations, paroxysmal nocturnal dyspnea and syncope.  Respiratory:  Positive for snoring.     Objective  There were no vitals taken for this visit.     02/01/2022    9:22 AM  Vitals with BMI  Height 5\' 4"   Weight 228 lbs 13 oz  BMI 39.25  Systolic 145  Diastolic 78  Pulse 39      Physical Exam Vitals reviewed.  Constitutional:      Appearance: She is obese.  HENT:     Head: Normocephalic and atraumatic.  Cardiovascular:     Rate and Rhythm: Normal rate and regular rhythm.     Pulses: Intact distal pulses.     Heart sounds: S1 normal and S2 normal. No murmur heard.    No gallop.  Pulmonary:     Effort: Pulmonary effort is normal. No respiratory distress.     Breath sounds: Rhonchi (bilaterally) present. No  wheezing or rales.  Musculoskeletal:     Right lower leg: No edema.     Left lower leg: No edema.  Neurological:     Mental Status: She is alert.     Laboratory examination:   No results for input(s): "NA", "K", "CL", "CO2", "GLUCOSE", "BUN", "CREATININE", "CALCIUM", "GFRNONAA", "GFRAA" in the last 8760 hours. CrCl cannot be calculated (No successful lab value found.).      No data to display              No data to display           Lipid Panel No results for input(s): "CHOL", "TRIG", "LDLCALC", "VLDL", "HDL", "CHOLHDL", "LDLDIRECT" in the last 8760 hours.  HEMOGLOBIN A1C No results found for: "HGBA1C", "MPG" TSH No results for input(s): "TSH" in  the last 8760 hours.  External labs:  01/31/2022: Creatinine 0.7, GFR >60, sodium 141, potassium 4.5, AST 20, ALT 22, BUN 10 Hgb 12.6, HCT 36.4, MCV 82.1, platelet 204 Triglycerides 65, HDL 52, LDL 97, total cholesterol 623 TSH 2.62  Allergies   Allergies  Allergen Reactions   Morphine Rash    Medications Prior to Visit:   Outpatient Medications Prior to Visit  Medication Sig Dispense Refill   aspirin 81 MG EC tablet Take 1 tablet by mouth daily.     LORazepam (ATIVAN) 0.5 MG tablet Take 0.5 mg by mouth 2 (two) times daily as needed.     losartan (COZAAR) 50 MG tablet Take 50 mg by mouth 2 (two) times daily.     Multiple Vitamin (MULTI VITAMIN) TABS Take 1 tablet by mouth daily.     Omega-3 Fatty Acids (FISH OIL) 1000 MG CAPS Take 1 capsule by mouth daily.     rosuvastatin (CRESTOR) 10 MG tablet Take 10 mg by mouth 2 (two) times a week.     sertraline (ZOLOFT) 50 MG tablet Take 50 mg by mouth daily.     No facility-administered medications prior to visit.   Final Medications at End of Visit    No outpatient medications have been marked as taking for the 03/15/22 encounter (Appointment) with Carlynn Purl, Farheen Pfahler C, PA-C.   Radiology:   No results found.  Cardiac Studies:  Ambulatory cardiac telemetry 7 days (02/01/2022 - 02/08/2022): Predominant underlying rhythm was sinus.  Patient with 4 episodes of ventricular tachycardia with the longest lasting 11 beats.  These episodes were asymptomatic.  She also had 41 episodes of supraventricular tachycardia with the longest episode lasting 41 beats.  Junctional escape rhythm was present.  She had 2 sinus pauses 3.2 seconds and 3.6 seconds, both of which occurred during sleep hours and were asymptomatic.  Patient had occasional PACs and rare PVCs.  Both ventricular bigeminy and trigeminy were present.   Notably on report there were 5 patient triggered events.  However upon further discussion with patient these were accidental triggers due to  itching in the area around the monitor.  She denies episodes of dizziness, lightheadedness, dyspnea, chest pain, syncope, near syncope.  Lexiscan (with Mod Bruce protocol) Nuclear stress test 02/13/2022: Nondiagnostic ECG stress. The heart rate response was consistent with Lexiscan. Additionally, patient exercised on a Modified Bruce protocol. Baseline heart rate was 37 bpm. A maximum heart rate of 113 beats per minute was achieved, which is 75% of the maximum predicted heart rate response. No symptoms.  Myocardial perfusion is normal. Overall LV systolic function is normal without regional wall motion abnormalities. Stress LV EF: 62%.  No previous  exam available for comparison. Low risk study.   Echocardiogram 02/02/2022:  Normal LV systolic function with EF 55%. Left ventricle cavity is normal  in size. Normal global wall motion. Normal diastolic filling pattern.  Calculated EF 55%.  Left atrial cavity is moderately dilated at 4.6 cm.  Structurally normal mitral valve.  Mild (Grade I) mitral regurgitation.  Structurally normal tricuspid valve.  Moderate tricuspid regurgitation.  Mild pulmonary hypertension. RVSP measures 34 mmHg.  Structurally normal pulmonic valve.  Moderate pulmonic regurgitation.  Marked sinus bradycardia throughout the exam.  EKG:   02/01/2022: Marked sinus bradycardia at a rate of 36 bpm.  Normal axis.  Poor R wave progression, cannot exclude anteroseptal infarct old.  Nonspecific T wave abnormality.  No evidence of ischemia or underlying injury pattern.  Assessment   No diagnosis found.    There are no discontinued medications.  No orders of the defined types were placed in this encounter.   Recommendations:   Desiree James is a 71 y.o.  Caucasian female with history of hypertension, hyperlipidemia, anxiety.  Denies history of diabetes, tobacco use, MI, CVA/TIA.  Patient was originally referred to our office by PCP given marked bradycardia noted on PCP  visit.  Patient was last seen in our office 02/01/2022 at which time ordered cardiac monitor and referred to sleep medicine, ordered echocardiogram and stress test. Echocardiogram revealed preserved LVEF, mild and moderate valvular disease as well as mild pulmonary hypertension. Stress test was low risk. Cardiac monitor finding suggestive of tachy-brady syndrome and patient was referred to EP for further recommendations.   Patient now presents for follow up. ***  ***  Patient is fairly asymptomatic in regard to bradycardia, with her only specific complaint being generalized fatigue over the last few months.  Patient walked in the hallway of our office with starting heart rate 39 bpm, ending heart rate 57 bpm, maximum heart rate 63 bpm.  Patient was asymptomatic during this walk she walks on a daily basis at home without issue.  Discussed at length with patient evaluating for reversible causes of bradycardia.  Patient is presently taking Ativan twice daily every day, advised her to discuss with alternative anxiety management options as Ativan may be contributing to bradycardia.  I have requested PCP labs, specifically thyroid function testing for review.  Patient is not presently on AV nodal blocking agents.  She does snore, and with complaints of fatigue, would recommend repeat sleep evaluation.  We will refer patient to sleep medicine for further recommendations.   She demonstrates chronotropic competence and is relatively asymptomatic, therefore no indication for pacemaker or hospitalization at this time.  However counseled patient at length regarding signs and symptoms that would warrant urgent or emergent evaluation, she verbalized understanding and agreement as did her husband.  From a cardiac standpoint we will obtain ambulatory telemetry to evaluate for underlying significant arrhythmias.  We will also obtain echocardiogram.  Given cardiovascular risk factors including obesity, hypertension,  hyperlipidemia we will obtain nuclear treadmill stress test.   Patient's blood pressure is elevated above goal in the office today, however given bradycardia will not make changes at this time.  Follow-up in 6 weeks, sooner if needed.   Alethia Berthold, PA-C 03/14/2022, 12:08 PM Office: [phone]  CC: Dr. Reynold Bowen

## 2022-03-15 ENCOUNTER — Encounter: Payer: Self-pay | Admitting: Student

## 2022-03-15 ENCOUNTER — Ambulatory Visit: Payer: Medicare HMO | Admitting: Student

## 2022-03-15 VITALS — BP 176/62 | HR 43 | Temp 98.2°F | Resp 16 | Ht 64.0 in | Wt 226.0 lb

## 2022-03-15 DIAGNOSIS — R0683 Snoring: Secondary | ICD-10-CM

## 2022-03-15 DIAGNOSIS — I495 Sick sinus syndrome: Secondary | ICD-10-CM

## 2022-03-16 ENCOUNTER — Encounter: Payer: Self-pay | Admitting: Internal Medicine

## 2022-03-16 ENCOUNTER — Ambulatory Visit: Payer: Medicare HMO | Admitting: Cardiology

## 2022-03-16 ENCOUNTER — Ambulatory Visit: Payer: Medicare HMO | Admitting: Internal Medicine

## 2022-03-16 VITALS — BP 218/100 | HR 36 | Ht 64.0 in | Wt 224.2 lb

## 2022-03-16 DIAGNOSIS — I459 Conduction disorder, unspecified: Secondary | ICD-10-CM

## 2022-03-16 LAB — CBC WITH DIFFERENTIAL/PLATELET
Basophils Absolute: 0.1 10*3/uL (ref 0.0–0.2)
Basos: 1 %
EOS (ABSOLUTE): 0.2 10*3/uL (ref 0.0–0.4)
Eos: 3 %
Hematocrit: 38.9 % (ref 34.0–46.6)
Hemoglobin: 12.8 g/dL (ref 11.1–15.9)
Immature Grans (Abs): 0 10*3/uL (ref 0.0–0.1)
Immature Granulocytes: 0 %
Lymphocytes Absolute: 1.9 10*3/uL (ref 0.7–3.1)
Lymphs: 26 %
MCH: 27.1 pg (ref 26.6–33.0)
MCHC: 32.9 g/dL (ref 31.5–35.7)
MCV: 82 fL (ref 79–97)
Monocytes Absolute: 0.5 10*3/uL (ref 0.1–0.9)
Monocytes: 6 %
Neutrophils Absolute: 4.9 10*3/uL (ref 1.4–7.0)
Neutrophils: 64 %
Platelets: 267 10*3/uL (ref 150–450)
RBC: 4.72 x10E6/uL (ref 3.77–5.28)
RDW: 13.1 % (ref 11.7–15.4)
WBC: 7.5 10*3/uL (ref 3.4–10.8)

## 2022-03-16 LAB — BASIC METABOLIC PANEL
BUN/Creatinine Ratio: 21 (ref 12–28)
BUN: 17 mg/dL (ref 8–27)
CO2: 25 mmol/L (ref 20–29)
Calcium: 9.7 mg/dL (ref 8.7–10.3)
Chloride: 102 mmol/L (ref 96–106)
Creatinine, Ser: 0.8 mg/dL (ref 0.57–1.00)
Glucose: 102 mg/dL — ABNORMAL HIGH (ref 70–99)
Potassium: 4.6 mmol/L (ref 3.5–5.2)
Sodium: 141 mmol/L (ref 134–144)
eGFR: 80 mL/min/{1.73_m2} (ref >59)

## 2022-03-16 NOTE — H&P (View-Only) (Signed)
HPI Desiree James is referred by Dr. Evlyn Kanner for evaluation of sinus node dysfunction. She is a pleasant 71 yo woman with a h/o HTN, remote stroke and tobacco abuse. She has never had syncope and she was noted to have preserved LV function. She does have fatigue and is tired. She was found to have HR's while awake and during the day in the mid 30's. Junctional rhythm. She presents to discuss PPM insertion. Allergies  Allergen Reactions   Morphine Rash     Current Outpatient Medications  Medication Sig Dispense Refill   aspirin 81 MG EC tablet Take 1 tablet by mouth daily.     ferrous sulfate 325 (65 FE) MG EC tablet Take 325 mg by mouth 3 (three) times daily with meals.     losartan (COZAAR) 50 MG tablet Take 50 mg by mouth 2 (two) times daily.     Multiple Vitamin (MULTI VITAMIN) TABS Take 1 tablet by mouth daily.     Omega-3 Fatty Acids (FISH OIL) 1000 MG CAPS Take 1 capsule by mouth daily.     rosuvastatin (CRESTOR) 10 MG tablet Take 10 mg by mouth 2 (two) times a week.     sertraline (ZOLOFT) 50 MG tablet Take 50 mg by mouth daily.     No current facility-administered medications for this visit.     Past Medical History:  Diagnosis Date   Hypertension     ROS:   All systems reviewed and negative except as noted in the HPI.   Past Surgical History:  Procedure Laterality Date   APPENDECTOMY     GASTRIC BYPASS     HEMORROIDECTOMY     OOPHORECTOMY     OVARIAN CYST SURGERY       Family History  Problem Relation Age of Onset   Stroke Mother    Stroke Father    Pneumonia Father    Breast cancer Sister    Pneumonia Brother    Cancer Brother      Social History   Socioeconomic History   Marital status: Married    Spouse name: Not on file   Number of children: 2   Years of education: Not on file   Highest education level: Not on file  Occupational History   Not on file  Tobacco Use   Smoking status: Never   Smokeless tobacco: Never  Vaping Use    Vaping Use: Never used  Substance and Sexual Activity   Alcohol use: Yes    Comment: occ   Drug use: Never   Sexual activity: Not on file  Other Topics Concern   Not on file  Social History Narrative   Not on file   Social Determinants of Health   Financial Resource Strain: Not on file  Food Insecurity: Not on file  Transportation Needs: Not on file  Physical Activity: Not on file  Stress: Not on file  Social Connections: Not on file  Intimate Partner Violence: Not on file     BP (!) 218/100   Pulse (!) 36   Ht 5\' 4"  (1.626 m)   Wt 224 lb 3.2 oz (101.7 kg)   BMI 38.48 kg/m   Physical Exam:  Well appearing NAD HEENT: Unremarkable Neck:  No JVD, no thyromegally Lymphatics:  No adenopathy Back:  No CVA tenderness Lungs:  Clear with no wheezes HEART:  Regular rate rhythm, no murmurs, no rubs, no clicks Abd:  soft, positive bowel sounds, no organomegally, no rebound, no guarding  Ext:  2 plus pulses, no edema, no cyanosis, no clubbing Skin:  No rashes no nodules Neuro:  CN II through XII intact, motor grossly intact  EKG - junctional brady at 36/min  Assess/Plan:  Sinus node dysfunction - she has no p waves today and has a junctional escape at 36/min. I have discussed the treament options in detail. The risks/benefits/goals/expectations of PPM insertion have been reviewed and she wishes to proceed. HTN - her bp is quite elevated today. I will add coreg after her PPM insertion. She will recheck and if it remains high she will need to reach out to either Dr. Evlyn Kanner or [place] [facility] if it remains elevated.  Desiree Gowda Bryttany Tortorelli,MD

## 2022-03-16 NOTE — Progress Notes (Signed)
    HPI Ms. Baltzell is referred by Dr. South for evaluation of sinus node dysfunction. She is a pleasant 71 yo woman with a h/o HTN, remote stroke and tobacco abuse. She has never had syncope and she was noted to have preserved LV function. She does have fatigue and is tired. She was found to have HR's while awake and during the day in the mid 30's. Junctional rhythm. She presents to discuss PPM insertion. Allergies  Allergen Reactions   Morphine Rash     Current Outpatient Medications  Medication Sig Dispense Refill   aspirin 81 MG EC tablet Take 1 tablet by mouth daily.     ferrous sulfate 325 (65 FE) MG EC tablet Take 325 mg by mouth 3 (three) times daily with meals.     losartan (COZAAR) 50 MG tablet Take 50 mg by mouth 2 (two) times daily.     Multiple Vitamin (MULTI VITAMIN) TABS Take 1 tablet by mouth daily.     Omega-3 Fatty Acids (FISH OIL) 1000 MG CAPS Take 1 capsule by mouth daily.     rosuvastatin (CRESTOR) 10 MG tablet Take 10 mg by mouth 2 (two) times a week.     sertraline (ZOLOFT) 50 MG tablet Take 50 mg by mouth daily.     No current facility-administered medications for this visit.     Past Medical History:  Diagnosis Date   Hypertension     ROS:   All systems reviewed and negative except as noted in the HPI.   Past Surgical History:  Procedure Laterality Date   APPENDECTOMY     GASTRIC BYPASS     HEMORROIDECTOMY     OOPHORECTOMY     OVARIAN CYST SURGERY       Family History  Problem Relation Age of Onset   Stroke Mother    Stroke Father    Pneumonia Father    Breast cancer Sister    Pneumonia Brother    Cancer Brother      Social History   Socioeconomic History   Marital status: Married    Spouse name: Not on file   Number of children: 2   Years of education: Not on file   Highest education level: Not on file  Occupational History   Not on file  Tobacco Use   Smoking status: Never   Smokeless tobacco: Never  Vaping Use    Vaping Use: Never used  Substance and Sexual Activity   Alcohol use: Yes    Comment: occ   Drug use: Never   Sexual activity: Not on file  Other Topics Concern   Not on file  Social History Narrative   Not on file   Social Determinants of Health   Financial Resource Strain: Not on file  Food Insecurity: Not on file  Transportation Needs: Not on file  Physical Activity: Not on file  Stress: Not on file  Social Connections: Not on file  Intimate Partner Violence: Not on file     BP (!) 218/100   Pulse (!) 36   Ht 5' 4" (1.626 m)   Wt 224 lb 3.2 oz (101.7 kg)   BMI 38.48 kg/m   Physical Exam:  Well appearing NAD HEENT: Unremarkable Neck:  No JVD, no thyromegally Lymphatics:  No adenopathy Back:  No CVA tenderness Lungs:  Clear with no wheezes HEART:  Regular rate rhythm, no murmurs, no rubs, no clicks Abd:  soft, positive bowel sounds, no organomegally, no rebound, no guarding   Ext:  2 plus pulses, no edema, no cyanosis, no clubbing Skin:  No rashes no nodules Neuro:  CN II through XII intact, motor grossly intact  EKG - junctional brady at 36/min  Assess/Plan:  Sinus node dysfunction - she has no p waves today and has a junctional escape at 36/min. I have discussed the treament options in detail. The risks/benefits/goals/expectations of PPM insertion have been reviewed and she wishes to proceed. HTN - her bp is quite elevated today. I will add coreg after her PPM insertion. She will recheck and if it remains high she will need to reach out to either Dr. Evlyn Kanner or [place] [facility] if it remains elevated.  Sharlot Gowda Mehar Kirkwood,MD

## 2022-03-16 NOTE — Patient Instructions (Addendum)
Medication Instructions:  Your physician recommends that you continue on your current medications as directed. Please refer to the Current Medication list given to you today.  Labwork: You will get lab work today:  CBC and BMP  Testing/Procedures: None ordered.  Follow-Up:  SEE INSTRUCTION LETTER  Any Other Special Instructions Will Be Listed Below (If Applicable).  If you need a refill on your cardiac medications before your next appointment, please call your pharmacy.   Pacemaker Implantation, Adult Pacemaker implantation is a procedure to place a pacemaker inside the chest. A pacemaker is a small computer that sends electrical signals to the heart and helps the heart beat normally. A pacemaker also stores information about heart rhythms. You may need pacemaker implantation if you have: A slow heartbeat (bradycardia). Loss of consciousness that happens repeatedly (syncope) or repeated episodes of dizziness or light-headedness because of an irregular heart rate. Shortness of breath (dyspnea) due to heart problems. The pacemaker usually attaches to your heart through a wire called a lead. One or two leads may be needed. There are different types of pacemakers: Transvenous pacemaker. This type is placed under the skin or muscle of your upper chest area. The lead goes through a vein in the chest area to reach the inside of the heart. Epicardial pacemaker. This type is placed under the skin or muscle of your chest or abdomen. The lead goes through your chest to the outside of the heart. Tell a health care provider about: Any allergies you have. All medicines you are taking, including vitamins, herbs, eye drops, creams, and over-the-counter medicines. Any problems you or family members have had with anesthetic medicines. Any blood or bone disorders you have. Any surgeries you have had. Any medical conditions you have. Whether you are pregnant or may be pregnant. What are the  risks? Generally, this is a safe procedure. However, problems may occur, including: Infection. Bleeding. Failure of the pacemaker or the lead. Collapse of a lung or bleeding into a lung. Blood clot inside a blood vessel with a lead. Damage to the heart. Infection inside the heart (endocarditis). Allergic reactions to medicines. What happens before the procedure? Staying hydrated Follow instructions from your health care provider about hydration, which may include: Up to 2 hours before the procedure - you may continue to drink clear liquids, such as water, clear fruit juice, black coffee, and plain tea.  Eating and drinking restrictions Follow instructions from your health care provider about eating and drinking, which may include: 8 hours before the procedure - stop eating heavy meals or foods, such as meat, fried foods, or fatty foods. 6 hours before the procedure - stop eating light meals or foods, such as toast or cereal. 6 hours before the procedure - stop drinking milk or drinks that contain milk. 2 hours before the procedure - stop drinking clear liquids. Medicines Ask your health care provider about: Changing or stopping your regular medicines. This is especially important if you are taking diabetes medicines or blood thinners. Taking medicines such as aspirin and ibuprofen. These medicines can thin your blood. Do not take these medicines unless your health care provider tells you to take them. Taking over-the-counter medicines, vitamins, herbs, and supplements. Tests You may have: A heart evaluation. This may include: An electrocardiogram (ECG). This involves placing patches on your skin to check your heart rhythm. A chest X-ray. An echocardiogram. This is a test that uses sound waves (ultrasound) to produce an image of the heart. A cardiac rhythm monitor.  This is used to record your heart rhythm and any events for a longer period of time. Blood tests. Genetic  testing. General instructions Do not use any products that contain nicotine or tobacco for at least 4 weeks before the procedure. These products include cigarettes, e-cigarettes, and chewing tobacco. If you need help quitting, ask your health care provider. Ask your health care provider: How your surgery site will be marked. What steps will be taken to help prevent infection. These steps may include: Removing hair at the surgery site. Washing skin with a germ-killing soap. Receiving antibiotic medicine. Plan to have someone take you home from the hospital or clinic. If you will be going home right after the procedure, plan to have someone with you for 24 hours. What happens during the procedure? An IV will be inserted into one of your veins. You will be given one or more of the following: A medicine to help you relax (sedative). A medicine to numb the area (local anesthetic). A medicine to make you fall asleep (general anesthetic). The next steps vary depending on the type of pacemaker you will be getting. If you are getting a transvenous pacemaker: An incision will be made in your upper chest. A pocket will be made for the pacemaker. It may be placed under the skin or between layers of muscle. The lead will be inserted into a blood vessel that goes to the heart. While X-rays are taken by an imaging machine (fluoroscopy), the lead will be advanced through the vein to the inside of your heart. The other end of the lead will be tunneled under the skin and attached to the pacemaker. If you are getting an epicardial pacemaker: An incision will be made near your ribs or breastbone (sternum) for the lead. The lead will be attached to the outside of your heart. Another incision will be made in your chest or upper abdomen to create a pocket for the pacemaker. The free end of the lead will be tunneled under the skin and attached to the pacemaker. The transvenous or epicardial pacemaker will be  tested. Imaging studies may be done to check the lead position. The incisions will be closed with stitches (sutures), adhesive strips, or skin glue. Bandages (dressings) will be placed over the incisions. The procedure may vary among health care providers and hospitals. What happens after the procedure? Your blood pressure, heart rate, breathing rate, and blood oxygen level will be monitored until you leave the hospital or clinic. You may be given antibiotics. You will be given pain medicine. An ECG and chest X-rays will be done. You may need to wear a continuous type of ECG (Holter monitor) to check your heart rhythm. Your health care provider will program the pacemaker. If you were given a sedative during the procedure, it can affect you for several hours. Do not drive or operate machinery until your health care provider says that it is safe. You will be given a pacemaker identification card. This card lists the implant date, device model, and manufacturer of your pacemaker. Summary A pacemaker is a small computer that sends electrical signals to the heart and helps the heart beat normally. There are different types of pacemakers. A pacemaker may be placed under the skin or muscle of your chest or abdomen. Follow instructions from your health care provider about eating and drinking and about taking medicines before the procedure. This information is not intended to replace advice given to you by your health care provider. Make  sure you discuss any questions you have with your health care provider. Document Revised: 05/24/2021 Document Reviewed: 08/13/2019 Elsevier Patient Education  2023 [employer].

## 2022-03-31 NOTE — Pre-Procedure Instructions (Signed)
Attempted to call patient regarding procedure instructions.  Left voice mail on the following items: ?Arrival time 0530 ?Nothing to eat or drink after midnight ?No meds AM of procedure ?Responsible person to drive you home and stay with you for 24 hrs ?Wash with special soap night before and morning of procedure ? ?

## 2022-04-03 ENCOUNTER — Ambulatory Visit (HOSPITAL_COMMUNITY): Payer: Medicare HMO

## 2022-04-03 ENCOUNTER — Encounter (HOSPITAL_COMMUNITY)
Admission: RE | Disposition: A | Discharge status: Home / Self Care | Payer: Self-pay | Source: Home / Self Care | Attending: Internal Medicine

## 2022-04-03 ENCOUNTER — Other Ambulatory Visit: Payer: Self-pay

## 2022-04-03 ENCOUNTER — Ambulatory Visit (HOSPITAL_COMMUNITY)
Admission: RE | Admit: 2022-04-03 | Discharge: 2022-04-03 | Disposition: A | Discharge status: Home / Self Care | Payer: Medicare HMO | Attending: Internal Medicine | Admitting: Internal Medicine

## 2022-04-03 ENCOUNTER — Ambulatory Visit: Payer: Medicare HMO | Admitting: Cardiology

## 2022-04-03 DIAGNOSIS — I495 Sick sinus syndrome: Secondary | ICD-10-CM | POA: Diagnosis not present

## 2022-04-03 DIAGNOSIS — Z8673 Personal history of transient ischemic attack (TIA), and cerebral infarction without residual deficits: Secondary | ICD-10-CM | POA: Insufficient documentation

## 2022-04-03 DIAGNOSIS — Z950 Presence of cardiac pacemaker: Secondary | ICD-10-CM | POA: Insufficient documentation

## 2022-04-03 DIAGNOSIS — I459 Conduction disorder, unspecified: Secondary | ICD-10-CM | POA: Insufficient documentation

## 2022-04-03 DIAGNOSIS — I10 Essential (primary) hypertension: Secondary | ICD-10-CM | POA: Diagnosis not present

## 2022-04-03 HISTORY — PX: PACEMAKER IMPLANT: EP1218

## 2022-04-03 SURGERY — PACEMAKER IMPLANT

## 2022-04-03 MED ORDER — SODIUM CHLORIDE 0.9 % IV SOLN
80.0000 mg | INTRAVENOUS | Status: AC
  Administered 2022-04-03: 80 mg

## 2022-04-03 MED ORDER — LIDOCAINE HCL (PF) 1 % IJ SOLN
INTRAMUSCULAR | Status: DC | PRN
  Administered 2022-04-03: 60 mL

## 2022-04-03 MED ORDER — CHLORHEXIDINE GLUCONATE 4 % EX LIQD: 4.0000 | Freq: Once | CUTANEOUS | Status: DC

## 2022-04-03 MED ORDER — POVIDONE-IODINE 10 % EX SWAB: 2.0000 | Freq: Once | CUTANEOUS | Status: DC

## 2022-04-03 MED ORDER — CEFAZOLIN SODIUM-DEXTROSE 2-4 GM/100ML-% IV SOLN
INTRAVENOUS | Status: AC
  Filled 2022-04-03: qty 100

## 2022-04-03 MED ORDER — SODIUM CHLORIDE 0.9 % IV SOLN: INTRAVENOUS | Status: DC

## 2022-04-03 MED ORDER — CEFAZOLIN SODIUM-DEXTROSE 2-4 GM/100ML-% IV SOLN
2.0000 g | INTRAVENOUS | Status: AC
  Administered 2022-04-03: 2 g via INTRAVENOUS

## 2022-04-03 MED ORDER — ACETAMINOPHEN 325 MG PO TABS
325.0000 mg | ORAL_TABLET | ORAL | Status: DC | PRN
  Administered 2022-04-03: 650 mg via ORAL
  Filled 2022-04-03: qty 2

## 2022-04-03 MED ORDER — FENTANYL CITRATE (PF) 100 MCG/2ML IJ SOLN
INTRAMUSCULAR | Status: DC | PRN
  Administered 2022-04-03 (×2): 12.5 ug via INTRAVENOUS

## 2022-04-03 MED ORDER — ASPIRIN 81 MG PO TBEC: 81.0000 mg | DELAYED_RELEASE_TABLET | Freq: Every day | ORAL | 12 refills | Status: DC

## 2022-04-03 MED ORDER — LIDOCAINE HCL (PF) 1 % IJ SOLN
INTRAMUSCULAR | Status: AC
  Filled 2022-04-03: qty 60

## 2022-04-03 MED ORDER — SODIUM CHLORIDE 0.9 % IV SOLN
INTRAVENOUS | Status: AC
  Filled 2022-04-03: qty 2

## 2022-04-03 MED ORDER — CEFAZOLIN SODIUM-DEXTROSE 1-4 GM/50ML-% IV SOLN
1.0000 g | Freq: Once | INTRAVENOUS | Status: AC
  Administered 2022-04-03: 1 g via INTRAVENOUS
  Filled 2022-04-03 (×2): qty 50

## 2022-04-03 MED ORDER — MIDAZOLAM HCL 5 MG/5ML IJ SOLN
INTRAMUSCULAR | Status: AC
  Filled 2022-04-03: qty 5

## 2022-04-03 MED ORDER — FENTANYL CITRATE (PF) 100 MCG/2ML IJ SOLN
INTRAMUSCULAR | Status: AC
  Filled 2022-04-03: qty 2

## 2022-04-03 MED ORDER — HEPARIN (PORCINE) IN NACL 1000-0.9 UT/500ML-% IV SOLN
INTRAVENOUS | Status: DC | PRN
  Administered 2022-04-03: 500 mL

## 2022-04-03 MED ORDER — ONDANSETRON HCL 4 MG/2ML IJ SOLN
4.0000 mg | Freq: Four times a day (QID) | INTRAMUSCULAR | Status: DC | PRN
Start: 2022-04-03 — End: 2022-04-03

## 2022-04-03 MED ORDER — HEPARIN (PORCINE) IN NACL 1000-0.9 UT/500ML-% IV SOLN
INTRAVENOUS | Status: AC
  Filled 2022-04-03: qty 500

## 2022-04-03 MED ORDER — MIDAZOLAM HCL 5 MG/5ML IJ SOLN
INTRAMUSCULAR | Status: DC | PRN
  Administered 2022-04-03 (×2): 1 mg via INTRAVENOUS

## 2022-04-03 SURGICAL SUPPLY — 14 items
CABLE SURGICAL S-101-97-12 (CABLE) ×2 IMPLANT
CATH CPS LOCATOR 3D SM (CATHETERS) ×1 IMPLANT
HELIX LOCKING TOOL (MISCELLANEOUS) ×2
LEAD TENDRIL MRI 52CM LPA1200M (Lead) ×1 IMPLANT
LEAD TENDRIL SDX 2088TC-58CM (Lead) ×1 IMPLANT
MAT PREVALON FULL STRYKER (MISCELLANEOUS) ×1 IMPLANT
PACEMAKER ASSURITY DR-RF (Pacemaker) ×1 IMPLANT
PAD DEFIB RADIO PHYSIO CONN (PAD) ×2 IMPLANT
SHEATH 8FR PRELUDE SNAP 13 (SHEATH) ×1 IMPLANT
SHEATH 9FR PRELUDE SNAP 13 (SHEATH) ×1 IMPLANT
SLITTER AGILIS HISPRO (INSTRUMENTS) ×1 IMPLANT
TOOL HELIX LOCKING (MISCELLANEOUS) IMPLANT
TRAY PACEMAKER INSERTION (PACKS) ×2 IMPLANT
WIRE HI TORQ VERSACORE-J 145CM (WIRE) ×1 IMPLANT

## 2022-04-03 NOTE — Discharge Instructions (Signed)
After Your Pacemaker   You have a [employer] Pacemaker  ACTIVITY Do not lift your arm above shoulder height for 1 week after your procedure. After 7 days, you may progress as below.  You should remove your sling 24 hours after your procedure, unless otherwise instructed by your provider.     Monday April 10, 2022  Tuesday April 11, 2022 Wednesday April 12, 2022 Thursday April 13, 2022   Do not lift, push, pull, or carry anything over 10 pounds with the affected arm until 6 weeks (Monday May 15, 2022 ) after your procedure.   You may drive AFTER your wound check, unless you have been told otherwise by your provider.   Ask your healthcare provider when you can go back to work   INCISION/Dressing If you are on a blood thinner such as Coumadin, Xarelto, Eliquis, Plavix, or Pradaxa please confirm with your provider when this should be resumed.   If large square, outer bandage is left in place, this can be removed after 24 hours from your procedure. Do not remove steri-strips or glue as below.   Monitor your Pacemaker site for redness, swelling, and drainage. Call the device clinic at [phone] if you experience these symptoms or fever/chills.  If your incision is sealed with Steri-strips or staples, you may shower 7 days after your procedure or when told by your provider. Do not remove the steri-strips or let the shower hit directly on your site. You may wash around your site with soap and water.    If you were discharged in a sling, please do not wear this during the day more than 48 hours after your surgery unless otherwise instructed. This may increase the risk of stiffness and soreness in your shoulder.   Avoid lotions, ointments, or perfumes over your incision until it is well-healed.  You may use a hot tub or a pool AFTER your wound check appointment if the incision is completely closed.  Pacemaker Alerts:  Some alerts are vibratory and others beep. These are NOT emergencies.  Please call our office to let us know. If this occurs at night or on weekends, it can wait until the next business day. Send a remote transmission.  If your device is capable of reading fluid status (for heart failure), you will be offered monthly monitoring to review this with you.   DEVICE MANAGEMENT Remote monitoring is used to monitor your pacemaker from home. This monitoring is scheduled every 91 days by our office. It allows [place] to keep an eye on the functioning of your device to ensure it is working properly. You will routinely see your Electrophysiologist annually (more often if necessary).   You should receive your ID card for your new device in 4-8 weeks. Keep this card with you at all times once received. Consider wearing a medical alert bracelet or necklace.  Your Pacemaker may be MRI compatible. This will be discussed at your next office visit/wound check.  You should avoid contact with strong electric or magnetic fields.   Do not use amateur (ham) radio equipment or electric (arc) welding torches. MP3 player headphones with magnets should not be used. Some devices are safe to use if held at least 12 inches (30 cm) from your Pacemaker. These include power tools, lawn mowers, and speakers. If you are unsure if something is safe to use, ask your health care provider.  When using your cell phone, hold it to the ear that is on the opposite side from  the Pacemaker. Do not leave your cell phone in a pocket over the Pacemaker.  You may safely use electric blankets, heating pads, computers, and microwave ovens.  Call the office right away if: You have chest pain. You feel more short of breath than you have felt before. You feel more light-headed than you have felt before. Your incision starts to open up.  This information is not intended to replace advice given to you by your health care provider. Make sure you discuss any questions you have with your health care provider.

## 2022-04-03 NOTE — Interval H&P Note (Signed)
History and Physical Interval Note:  04/03/2022 7:24 AM  Harless Nakayama [facility]  has presented today for surgery, with the diagnosis of heart block.  The various methods of treatment have been discussed with the patient and family. After consideration of risks, benefits and other options for treatment, the patient has consented to  Procedure(s): PACEMAKER IMPLANT (N/A) as a surgical intervention.  The patient's history has been reviewed, patient examined, no change in status, stable for surgery.  I have reviewed the patient's chart and labs.  Questions were answered to the patient's satisfaction.     Lewayne Bunting

## 2022-04-04 ENCOUNTER — Encounter (HOSPITAL_COMMUNITY): Payer: Self-pay | Admitting: Internal Medicine

## 2022-04-04 ENCOUNTER — Telehealth: Payer: Self-pay

## 2022-04-04 NOTE — Telephone Encounter (Signed)
-----   Message from Graciella Freer, PA-C sent at 04/03/2022  1:10 PM EDT ----- Same day PPM GT 04/03/2022

## 2022-04-04 NOTE — Telephone Encounter (Signed)
Pt LMOVM returning nurse call. 

## 2022-04-04 NOTE — Telephone Encounter (Signed)
Attempted to contact patient in regard to same day d/c. No answer, LMTCB.

## 2022-04-05 NOTE — Telephone Encounter (Signed)
Follow-up after same day discharge: Implant date: 04/03/22 MD: Lewayne Bunting, MD Device: SJ Dual chamber PPM Location: left chest   Wound check visit: 04/13/22 at 10:40 90 day MD follow-up: 07/10/22 at 3:45  Remote Transmission received:no  Dressing/sling removed: TBD  Confirm OAC restart on: TBD  Unsuccessful telephone encounter to patient to follow up post PPM implant. [employer] compliant VM message left requesting call back to [phone].

## 2022-04-05 NOTE — Telephone Encounter (Signed)
Pt is returning call.  

## 2022-04-05 NOTE — Telephone Encounter (Signed)
Pt LMOVM returning nurse call.

## 2022-04-05 NOTE — Telephone Encounter (Signed)
Third unsuccessful telephone encounter to patient. VM message left. Sent detailed information via MyChart message.

## 2022-04-06 ENCOUNTER — Telehealth: Payer: Self-pay

## 2022-04-06 NOTE — Telephone Encounter (Signed)
Follow-up after same day discharge: Implant date: 04/03/2022 MD: Lewayne Bunting, MD Device: PPM Location: L.Chest   Wound check visit: 04/13/2022 90 day MD follow-up: 07/10/2022  Remote Transmission received:Yes monitor updated 04/04/2022  Dressing/sling removed: yes   Confirm OAC restart on: NA  Spoke with patient informed her of lifting restrictions, and not to get incision site wet until 04/10/22 patient voiced understanding

## 2022-04-13 ENCOUNTER — Ambulatory Visit (INDEPENDENT_AMBULATORY_CARE_PROVIDER_SITE_OTHER): Payer: Medicare HMO

## 2022-04-13 DIAGNOSIS — I495 Sick sinus syndrome: Secondary | ICD-10-CM | POA: Diagnosis not present

## 2022-04-13 LAB — CUP PACEART INCLINIC DEVICE CHECK
Date Time Interrogation Session: 20230720151614
Implantable Lead Implant Date: 20230710
Implantable Lead Implant Date: 20230710
Implantable Lead Location: 753859
Implantable Lead Location: 753860
Implantable Pulse Generator Implant Date: 20230710
Pulse Gen Model: 2272
Pulse Gen Serial Number: 600177784

## 2022-04-13 NOTE — Progress Notes (Signed)
Wound check appointment. Steri-strips removed. Wound without redness or edema. Stitch noted at center of incision site, stitch clipped, incision site superficially unapproximated steri-strips applied. Normal device function. Thresholds, sensing, and impedances consistent with implant measurements. Device programmed at 3.5V/auto capture programmed on for extra safety margin until 3 month visit. Histogram distribution appropriate for patient and level of activity. No mode switches or high ventricular rates noted. Patient educated about wound care, arm mobility, lifting restrictions. ROV in 1 week on 04/20/22 for wound recheck.

## 2022-04-13 NOTE — Patient Instructions (Addendum)
   After Your Pacemaker   Monitor your pacemaker site for redness, swelling, and drainage. Call the device clinic at [phone] if you experience these symptoms or fever/chills.  Your incision was closed with Steri-strips or staples:  You may shower 7 days after your procedure and wash your incision with soap and water. Avoid lotions, ointments, or perfumes over your incision until it is well-healed. No shower/pool/hot tub until wound recheck in 1 week.   Do not lift, push or pull greater than 10 pounds with the affected arm until 6 weeks after your procedure. There are no other restrictions in arm movement after your wound check appointment. UNTIL AFTER AUGUST 21  You may drive, unless driving has been restricted by your healthcare providers.   Remote monitoring is used to monitor your pacemaker from home. This monitoring is scheduled every 91 days by our office. It allows [place] to keep an eye on the functioning of your device to ensure it is working properly. You will routinely see your Electrophysiologist annually (more often if necessary).  Be sure to resume taking your aspirin now.  Recheck wound in 1 week in device clinic:  July 27th at 10:00am in Device Clinic.

## 2022-04-20 ENCOUNTER — Ambulatory Visit (INDEPENDENT_AMBULATORY_CARE_PROVIDER_SITE_OTHER): Payer: Medicare HMO

## 2022-04-20 NOTE — Progress Notes (Signed)
Wound re-check appointment. Steri-strips removed. Wound without redness or edema. Patient advised she may now shower and wash incision gently with soap and water utilizing a clean washcloth and towel with each shower. Patient is advised to continue to monitor incision for s/s of infection and to notify device clinic with any concerns.

## 2022-04-20 NOTE — Patient Instructions (Addendum)
   After Your Pacemaker   Monitor your pacemaker site for redness, swelling, and drainage. Call the device clinic at [phone] if you experience these symptoms or fever/chills.  Your incision was closed with Steri-strips or staples:  You may shower  and wash your incision with soap and water. Avoid lotions, ointments, or perfumes over your incision until it is well-healed.  You may use a hot tub or a pool after your wound check appointment if the incision is completely closed.  Do not lift, push or pull greater than 10 pounds with the affected arm until 6 weeks after your procedure. There are no other restrictions in arm movement after your wound check appointment.  You may drive, unless driving has been restricted by your healthcare providers.   Remote monitoring is used to monitor your pacemaker from home. This monitoring is scheduled every 91 days by our office. It allows [place] to keep an eye on the functioning of your device to ensure it is working properly. You will routinely see your Electrophysiologist annually (more often if necessary).

## 2022-05-02 DIAGNOSIS — H524 Presbyopia: Secondary | ICD-10-CM | POA: Diagnosis not present

## 2022-05-02 DIAGNOSIS — H5213 Myopia, bilateral: Secondary | ICD-10-CM | POA: Diagnosis not present

## 2022-05-09 ENCOUNTER — Encounter: Payer: Self-pay | Admitting: Neurology

## 2022-05-09 ENCOUNTER — Ambulatory Visit (INDEPENDENT_AMBULATORY_CARE_PROVIDER_SITE_OTHER): Payer: Medicare HMO | Admitting: Neurology

## 2022-05-09 VITALS — BP 172/85 | HR 72 | Ht 64.0 in | Wt 223.0 lb

## 2022-05-09 DIAGNOSIS — R001 Bradycardia, unspecified: Secondary | ICD-10-CM | POA: Diagnosis not present

## 2022-05-09 DIAGNOSIS — Z950 Presence of cardiac pacemaker: Secondary | ICD-10-CM | POA: Insufficient documentation

## 2022-05-09 DIAGNOSIS — M2619 Other specified anomalies of jaw-cranial base relationship: Secondary | ICD-10-CM | POA: Diagnosis not present

## 2022-05-09 HISTORY — DX: Presence of cardiac pacemaker: Z95.0

## 2022-05-09 NOTE — Progress Notes (Signed)
SLEEP MEDICINE CLINIC    Provider:  Melvyn Novas, MD  Primary Care Physician:  Adrian Prince, MD [address] [place] [address]     Referring Provider: Dr Rosemary Holms , MD  [facility].        Chief Complaint according to patient   Patient presents with:     New Patient (Initial Visit)           HISTORY OF PRESENT ILLNESS:  05-09-2022;  Desiree James is a 71 y.o. year- old Caucasian female patient  is seen here upon Dr. Damian Leavell  referral on 05/09/2022 .  Chief concern according to patient :   Here for sleep consult. Last full PSG was performed at [facility], [address]  [address], 20 years ago and didn't indicate OSA. Had a cardiology workup due to low heart rate. They noticed pauses with bradycardia while she was wearing a monitor. Has HTN.  She sleeps 7 hours of good quality sleep and and wakes up restful. Since the referral was placed  she had  a pacemaker implanted on 04/03/22. Dr. Sharrell Ku.     Desiree James [facility] has an incomplete medical history in Epic.  Birth she has a history of bradycardia s/p pacemaker implantation, surgical history also of ovarian cyst surgery oophorectomy, hemorrhoidectomy, gastric bypass surgery in the year 2003, appendectomy at age about 37 years ago at age 36.   Sleep relevant medical history:  no Tonsillectomy, rhinoplasty with deviated septum repair. UPPP.    Family medical /sleep history: Sister on CPAP with OSA, she also added that both parents suffered strokes within 90 days of each other, her father had suffered from a pneumonia before her sister who also has apnea was diagnosed with breast cancer, a brother had pneumonia and cancer of the thyroid. Social history:  left house at 64, worked first at [employer]. Patient is remarried , retired from Ship broker office work- and lives in a household with spouse, age 50. Spouse is a retired Education officer, museum, Company secretary .  Family status is  remarried  with adult children, one son and one daughter , 3 grandchildren.  One dog is present. Sleeps in her bed.  Tobacco use- grew up with smoking parents.   ETOH use ; none   Caffeine intake in form of Coffee( 1 cup) Soda( /) Tea ( iced tea 1-2 glasses  in early day) or energy drinks. Regular exercise in form of walking the dog.   Hobbies :cooking and crafts.       Sleep habits are as follows:  The patient's dinner time is between 6 PM. The patient goes to bed at 11 PM and is promptly asleep, continues to sleep for total of 7 hours, wakes up for the dog- not for her bathroom breaks.   The preferred sleep position is laterally, mostly left, with the support of 1 pillow on a flat bed.   Dreams are reportedly frequent/vivid.  . The patient wakes up spontaneously about 8 AM. 8 AM is the usual rise time . She reports not feeling refreshed or restored in AM, without symptoms such as dry mouth, no morning headaches, and not feeling any residual fatigue. Naps are taken infrequently, she may fall asleep in front of the TV -lasting from 20 to 30 minutes and are not interfering nocturnal sleep.    Review of Systems: Out of a complete 14 system review, the patient complains of only the following symptoms, and all other  reviewed systems are negative.:   snoring, according to husband.   How likely are you to doze in the following situations: 0 = not likely, 1 = slight chance, 2 = moderate chance, 3 = high chance   Sitting and Reading? Watching Television? Sitting inactive in a public place (theater or meeting)? As a passenger in a car for an hour without a break? Lying down in the afternoon when circumstances permit? Sitting and talking to someone? Sitting quietly after lunch without alcohol? In a car, while stopped for a few minutes in traffic?   Total = 7/ 24 points   FSS endorsed at 29/ 63 points.   GDS : 2/ 15 points.   Social History   Socioeconomic History   Marital status:  Married    Spouse name: Not on file   Number of children: 2   Years of education: 14 years- HS plus business associates degree   Highest education level:  Associates   Occupational History   Not on file                                             office and manufacture  Tobacco Use   Smoking status: Never   Smokeless tobacco: Never  Vaping Use   Vaping Use: Never used  Substance and Sexual Activity   Alcohol use: Yes, very rarely.    Comment: occ   Drug use: Never   Sexual activity: Not on file  Other Topics Concern   Not on file  Social History Narrative   Grew up in [place], [place] graduate.    Social Determinants of Health   Financial Resource Strain: Not on file  Food Insecurity: Not on file  Transportation Needs: Not on file  Physical Activity: Not on file  Stress: Not on file  Social Connections: Not on file    Family History  Problem Relation Age of Onset   Stroke Mother    Stroke Father    Pneumonia Father    Breast cancer Sister    Pneumonia Brother    Cancer Brother     Past Medical History:  Diagnosis Date   Hypertension     Past Surgical History:  Procedure Laterality Date   APPENDECTOMY     GASTRIC BYPASS  2003   HEMORROIDECTOMY     OOPHORECTOMY  1972   OVARIAN CYST SURGERY     PACEMAKER IMPLANT N/A 04/03/2022   Procedure: PACEMAKER IMPLANT;  Surgeon: Marinus Maw, MD;  Location: [facility];  Service: Cardiovascular;  Laterality: N/A;     Current Outpatient Medications on File Prior to Visit  Medication Sig Dispense Refill   aspirin EC 81 MG tablet Take 1 tablet (81 mg total) by mouth daily. 30 tablet 12   Calcium-Magnesium-Vitamin D (SUPER CAL-MAG-D PO) Take 1 tablet by mouth daily.     docusate sodium (COLACE) 100 MG capsule Take 100 mg by mouth daily.     ferrous sulfate 325 (65 FE) MG EC tablet Take 325 mg by mouth daily.     losartan (COZAAR) 50 MG tablet Take 50 mg by mouth daily.     Multiple Vitamin (MULTI VITAMIN) TABS  Take 1 tablet by mouth daily.     Multiple Vitamins-Minerals (PRESERVISION AREDS 2+MULTI VIT) CAPS Take 2 capsules by mouth daily.     Omega-3 Fatty Acids (FISH OIL) 1200  MG CAPS Take 1,200 mg by mouth daily.     rosuvastatin (CRESTOR) 10 MG tablet Take 10 mg by mouth 2 (two) times a week.     sertraline (ZOLOFT) 50 MG tablet Take 50 mg by mouth daily.     SUPER B COMPLEX/C PO Take 1 tablet by mouth daily.     No current facility-administered medications on file prior to visit.    Allergies  Allergen Reactions   Morphine Rash    Physical exam:  Today's Vitals   05/09/22 1254  BP: (!) 172/85  Pulse: 72  Weight: 223 lb (101.2 kg)  Height: 5\' 4"  (1.626 m)   Body mass index is 38.28 kg/m.   Wt Readings from Last 3 Encounters:  05/09/22 223 lb (101.2 kg)  04/03/22 224 lb 3.2 oz (101.7 kg)  03/16/22 224 lb 3.2 oz (101.7 kg)     Ht Readings from Last 3 Encounters:  05/09/22 5\' 4"  (1.626 m)  04/03/22 5\' 4"  (1.626 m)  03/16/22 5\' 4"  (1.626 m)      General: The patient is awake, alert and appears not in acute distress. The patient is well groomed. Head: Normocephalic, atraumatic. Neck is supple.  Mallampati 2- status post UPPP,  neck circumference:14 inches .  Nasal airflow  patent.  Retrognathia is not seen.  Dental status:  irregular .poor.  Cardiovascular:  Regular rate and cardiac rhythm by pulse,  without distended neck veins. Respiratory: Lungs are clear to auscultation.  Skin:  Without evidence of ankle edema, or rash. Trunk: The patient's posture is erect.   Neurologic exam : The patient is awake and alert, oriented to place and time.   Memory subjective described as intact.  Attention span & concentration ability appears normal.  Speech is fluent,  without  dysarthria, dysphonia or aphasia.  Mood and affect are appropriate.   Cranial nerves: no loss of smell or taste reported  Pupils are equal and briskly reactive to light. Funduscopic exam deferred. .   Extraocular movements in vertical and horizontal planes were intact and without nystagmus. No Diplopia. Visual fields by finger perimetry are intact. Hearing was intact to soft voice and finger rubbing.    Facial sensation intact to fine touch.  Facial motor strength is symmetric and tongue and uvula move midline.  Neck ROM : rotation, tilt and flexion extension were normal for age and shoulder shrug was symmetrical.    Motor exam:  Symmetric bulk, tone and ROM.   Normal tone without cog-wheeling, symmetric grip strength .   Sensory:  Fine touch, pinprick and vibration were tested  and  normal.  Proprioception tested in the upper extremities was normal.   Coordination: Rapid alternating movements in the fingers/hands were of normal speed.  The Finger-to-nose maneuver was intact without evidence of ataxia, dysmetria or tremor.   Gait and station: Patient could rise unassisted from a seated position, arms crossed over the chest- not barcing- walked without assistive device.  Stance is of normal width/ base. Deep tendon reflexes: in the upper and lower extremities are symmetric and intact.  Babinski response was deferred.       After spending a total time of  35  minutes face to face and additional time for physical and neurologic examination, review of laboratory studies,  personal review of imaging studies, reports and results of other testing and review of referral information / records as far as provided in visit, I have established the following assessments:  1) Desiree James reports having  no problemfalling asleep, staying asleep or the quality of her sleep.  She has a history of hypertension, she was recently diagnosed with bradycardia and received a pacemaker in July 2023.  She does not wake up unrefreshed or unrestored, there are no morning headaches, no nighttime clusters, shortness of breath or the feeling of congestion and choking.    Our meeting today resulted in 3 risk factors  for the presence of obstructive sleep apnea and bradycardia can be an indicator of apnea underlying.   #1 her BMI is elevated at 38.28.  she is status post Roux and Y - peak weight was 285 pounds. Lost initially weight down to 195 pounds #2 Hypertension is still present,  #3 the patient had undergone a UPPP in the past so if she snores or not may be not an indication if apnea is present or not.   And she has a crowded lower jaw her dental status is irregular she has slight retrognathia these are anatomical risk factors for the presence of sleep apnea.  My Plan is to proceed with:  1) screening for sleep apnea. - suspect OSA could be present. If there is no evidence of sleep apnea, we won't have to meet for follow up.   I would like to thank Reynold Bowen, MD and [place] [place] [place] No address on file for allowing me to meet with and to take care of this pleasant patient.    Desiree James [facility]  will only need to follow up if PSG is positive for OSA or CSA or hypoxemia.  I will share my notes with Dr Virgina Jock, Dr. Forde Dandy and Dr. Lovena Le.  Electronically signed by: [name], MD 05/09/2022 1:13 PM  [facility] Board certified by The [employer] of Sleep Medicine and Diplomate of the [employer] of Sleep Medicine. Board certified In Neurology through the [place], Fellow of the [employer] of Neurology. Medical Director of [facility].

## 2022-05-09 NOTE — Patient Instructions (Signed)
1) Desiree James reports having no problemfalling asleep, staying asleep or the quality of her sleep.  She has a history of hypertension, she was recently diagnosed with bradycardia and received a pacemaker in July 2023.  She does not wake up unrefreshed or unrestored, there are no morning headaches, no nighttime clusters, shortness of breath or the feeling of congestion and choking.    Our meeting today resulted in 3 risk factors for the presence of obstructive sleep apnea and bradycardia can be an indicator of apnea underlying.   #1 her BMI is elevated at 38.28.  she is status post Roux and Y - peak weight was 285 pounds. Lost initially weight down to 195 pounds #2 Hypertension is still present,  #3 the patient had undergone a UPPP in the past so if she snores or not may be not an indication if apnea is present or not.   And she has a crowded lower jaw her dental status is irregular she has slight retrognathia these are anatomical risk factors for the presence of sleep apnea.  My Plan is to proceed with:  1) screening for sleep apnea. - suspect OSA could be present. If there is no evidence of sleep apnea, we won't have to meet for follow up.  Screening for Sleep Apnea  Sleep apnea is a condition in which breathing pauses or becomes shallow during sleep. Sleep apnea screening is a test to determine if you are at risk for sleep apnea. The test includes a series of questions. It will only takes a few minutes. Your health care provider may ask you to have this test in preparation for surgery or as part of a physical exam. What are the symptoms of sleep apnea? Common symptoms of sleep apnea include: Snoring. Waking up often at night. Daytime sleepiness. Pauses in breathing. Choking or gasping during sleep. Irritability. Forgetfulness. Trouble thinking clearly. Depression. Personality changes. Most people with sleep apnea do not know that they have it. What are the advantages of sleep apnea  screening? Getting screened for sleep apnea can help: Ensure your safety. It is important for your health care providers to know whether or not you have sleep apnea, especially if you are having surgery or have other long-term (chronic) health conditions. Improve your health and allow you to get a better night's rest. Restful sleep can help you: Have more energy. Lose weight. Improve high blood pressure. Improve diabetes management. Prevent stroke. Prevent car accidents. What happens during the screening? Screening usually includes being asked a list of questions about your sleep quality. Some questions you may be asked include: Do you snore? Is your sleep restless? Do you have daytime sleepiness? Has a partner or spouse told you that you stop breathing during sleep? Have you had trouble concentrating or memory loss? What is your age? What is your neck circumference? To measure your neck, keep your back straight and gently wrap the tape measure around your neck. Put the tape measure at the middle of your neck, between your chin and collarbone. What is your sex assigned at birth? Do you have or are you being treated for high blood pressure? If your screening test is positive, you are at risk for the condition. Further testing may be needed to confirm a diagnosis of sleep apnea. Where to find more information You can find screening tools online or at your health care clinic. For more information about sleep apnea screening and healthy sleep, visit these websites: Centers for Disease Control and Prevention:  [employer]Ex[employer].br American Sleep Apnea Association: www.sleepapnea.org Contact a health care provider if: You think that you may have sleep apnea. Summary Sleep apnea screening can help determine if you are at risk for sleep apnea. It is important for your health care providers to know whether or not you have sleep apnea, especially if you are having surgery or have other chronic health  conditions. You may be asked to take a screening test for sleep apnea in preparation for surgery or as part of a physical exam. This information is not intended to replace advice given to you by your health care provider. Make sure you discuss any questions you have with your health care provider. Document Revised: 08/20/2020 Document Reviewed: 08/20/2020 Elsevier Patient Education  2023 [employer].

## 2022-05-30 ENCOUNTER — Telehealth: Payer: Self-pay | Admitting: Neurology

## 2022-05-30 NOTE — Telephone Encounter (Signed)
[employer] pending uploaded notes on the

## 2022-05-31 NOTE — Telephone Encounter (Signed)
LVM for pt to call back to schedule sleep study.  

## 2022-06-08 ENCOUNTER — Ambulatory Visit: Payer: Medicare HMO | Admitting: Cardiology

## 2022-06-11 DIAGNOSIS — I495 Sick sinus syndrome: Secondary | ICD-10-CM

## 2022-06-11 DIAGNOSIS — I10 Essential (primary) hypertension: Secondary | ICD-10-CM | POA: Insufficient documentation

## 2022-06-11 HISTORY — DX: Sick sinus syndrome: I49.5

## 2022-06-11 NOTE — Progress Notes (Signed)
Error

## 2022-06-12 ENCOUNTER — Ambulatory Visit: Payer: Medicare HMO | Admitting: Cardiology

## 2022-06-12 DIAGNOSIS — I495 Sick sinus syndrome: Secondary | ICD-10-CM

## 2022-06-12 DIAGNOSIS — I10 Essential (primary) hypertension: Secondary | ICD-10-CM

## 2022-06-12 NOTE — Telephone Encounter (Signed)
Patient called back.  NPSG- [employer] Josem Kaufmann: [id number] (exp. 05/30/22 to 08/28/22)   She is scheduled at [facility] for 07/03/22 at 9 pm.  Mailed packet to the patient.

## 2022-06-30 ENCOUNTER — Encounter: Payer: Self-pay | Admitting: Internal Medicine

## 2022-07-01 ENCOUNTER — Encounter: Payer: Self-pay | Admitting: Cardiology

## 2022-07-03 ENCOUNTER — Ambulatory Visit: Payer: Medicare HMO | Admitting: Cardiology

## 2022-07-03 NOTE — Telephone Encounter (Signed)
From pt

## 2022-07-04 NOTE — Telephone Encounter (Signed)
Cecilia, please follow up on this.  Thanks MJP

## 2022-07-10 ENCOUNTER — Encounter: Payer: Medicare HMO | Admitting: Internal Medicine

## 2022-07-17 ENCOUNTER — Encounter: Payer: Self-pay | Admitting: Cardiology

## 2022-07-17 ENCOUNTER — Ambulatory Visit: Payer: Medicare HMO | Admitting: Cardiology

## 2022-07-17 VITALS — BP 148/87 | HR 87 | Temp 98.2°F | Resp 17 | Ht 64.0 in | Wt 228.0 lb

## 2022-07-17 DIAGNOSIS — I495 Sick sinus syndrome: Secondary | ICD-10-CM | POA: Diagnosis not present

## 2022-07-17 DIAGNOSIS — I10 Essential (primary) hypertension: Secondary | ICD-10-CM | POA: Diagnosis not present

## 2022-07-17 DIAGNOSIS — E782 Mixed hyperlipidemia: Secondary | ICD-10-CM | POA: Insufficient documentation

## 2022-07-17 NOTE — Progress Notes (Signed)
Follow up visit  Subjective:   Desiree James, female    DOB: [birth year], 71 y.o.   MRN: [id number]    HPI  Chief Complaint  Patient presents with   Hypertension   Tachycardia   Follow-up    3 month     71 y.o. Caucasian female with hypertension, hyperlipidemia, sinus node dysfunction- now s/p pacemaker placement  Patient is doing well since pacemaker placement. She denies chest pain, shortness of breath, palpitations, leg edema, orthopnea, PND, TIA/syncope. Blood pressure remains elevated.    Current Outpatient Medications:    aspirin EC 81 MG tablet, Take 1 tablet (81 mg total) by mouth daily., Disp: 30 tablet, Rfl: 12   Calcium-Magnesium-Vitamin D (SUPER CAL-MAG-D PO), Take 1 tablet by mouth daily., Disp: , Rfl:    docusate sodium (COLACE) 100 MG capsule, Take 100 mg by mouth daily., Disp: , Rfl:    ferrous sulfate 325 (65 FE) MG EC tablet, Take 325 mg by mouth daily., Disp: , Rfl:    losartan (COZAAR) 50 MG tablet, Take 50 mg by mouth daily., Disp: , Rfl:    Multiple Vitamin (MULTI VITAMIN) TABS, Take 1 tablet by mouth daily., Disp: , Rfl:    Multiple Vitamins-Minerals (PRESERVISION AREDS 2+MULTI VIT) CAPS, Take 2 capsules by mouth daily., Disp: , Rfl:    Omega-3 Fatty Acids (FISH OIL) 1200 MG CAPS, Take 1,200 mg by mouth daily., Disp: , Rfl:    rosuvastatin (CRESTOR) 10 MG tablet, Take 10 mg by mouth 2 (two) times a week., Disp: , Rfl:    sertraline (ZOLOFT) 50 MG tablet, Take 50 mg by mouth daily., Disp: , Rfl:    SUPER B COMPLEX/C PO, Take 1 tablet by mouth daily., Disp: , Rfl:    Cardiovascular & other pertient studies:  Reviewed external labs and tests, independently interpreted  EKG 04/03/2022: Atrial-paced rhythm with prolonged AV conduction Moderate voltage criteria for LVH, may be normal variant ( R in aVL , Cornell product ) Nonspecific T wave abnormality Prolonged QT  Pacemaker placement 04/03/2022: 1. Successful implantation of a [employer] Jude  dual-chamber pacemaker for symptomatic bradycardia due to sinus node dysfunction  2. No early apparent complications.     Echocardiogram 02/02/2022:  Normal LV systolic function with EF 55%. Left ventricle cavity is normal  in size. Normal global wall motion. Normal diastolic filling pattern.  Calculated EF 55%.  Left atrial cavity is moderately dilated at 4.6 cm.  Structurally normal mitral valve.  Mild (Grade I) mitral regurgitation.  Structurally normal tricuspid valve.  Moderate tricuspid regurgitation.  Mild pulmonary hypertension. RVSP measures 34 mmHg.  Structurally normal pulmonic valve.  Moderate pulmonic regurgitation.  Marked sinus bradycardia throughout the exam.  Recent labs: 03/16/2022: Glucose 102, BUN/Cr 17/0.8. EGFR 80. Na/K 141/4.6.  H/H 12/38. MCV 82. Platelets 267   Review of Systems  Cardiovascular:  Negative for chest pain, dyspnea on exertion, leg swelling, palpitations and syncope.         Vitals:   07/17/22 1458  BP: (!) 148/87  Pulse: 87  Resp: 17  Temp: 98.2 F (36.8 C)  SpO2: 96%    Body mass index is 39.14 kg/m. Filed Weights   07/17/22 1458  Weight: 228 lb (103.4 kg)     Objective:   Physical Exam Vitals and nursing note reviewed.  Constitutional:      General: She is not in acute distress. Neck:     Vascular: No JVD.  Cardiovascular:     Rate and  Rhythm: Normal rate and regular rhythm.     Heart sounds: Normal heart sounds. No murmur heard.    Comments: Well healed pacemaker pocket scar  Pulmonary:     Effort: Pulmonary effort is normal.     Breath sounds: Normal breath sounds. No wheezing or rales.  Musculoskeletal:     Right lower leg: No edema.     Left lower leg: No edema.             Visit diagnoses:   ICD-10-CM   1. Sinus node dysfunction (HCC)  S11.1 Basic Metabolic Panel (BMET)    Basic Metabolic Panel (BMET)    2. Essential hypertension  B52 Basic Metabolic Panel (BMET)    Basic Metabolic Panel (BMET)     3. Mixed hyperlipidemia  E78.2        Orders Placed This Encounter  Procedures   Basic Metabolic Panel (BMET)     Assessment & Recommendations:   71 y.o. Caucasian female with hypertension, hyperlipidemia, sinus node dysfunction- now s/p pacemaker placement  Sinus node dysfunction: Now s/p pacemaker placement  Hypertension: Uncontrolled.  Increased losartan to 100 mg daily. Check BMP in 1 week. If SBP remains >140 mmHg, will add amlodipine.    Nigel Mormon, MD Pager: [phone] Office: [phone]

## 2022-07-18 ENCOUNTER — Ambulatory Visit (INDEPENDENT_AMBULATORY_CARE_PROVIDER_SITE_OTHER): Payer: Medicare HMO | Admitting: Neurology

## 2022-07-18 DIAGNOSIS — G4733 Obstructive sleep apnea (adult) (pediatric): Secondary | ICD-10-CM

## 2022-07-18 DIAGNOSIS — Z950 Presence of cardiac pacemaker: Secondary | ICD-10-CM

## 2022-07-18 DIAGNOSIS — R001 Bradycardia, unspecified: Secondary | ICD-10-CM

## 2022-07-18 DIAGNOSIS — M2619 Other specified anomalies of jaw-cranial base relationship: Secondary | ICD-10-CM

## 2022-07-24 ENCOUNTER — Encounter: Payer: Medicare HMO | Admitting: Internal Medicine

## 2022-07-28 NOTE — Procedures (Signed)
[facility] POLYSOMNOGRAPHY INTERPRETATION REPORT    STUDY DATE: 07/18/2022     PATIENT NAME: Desiree James   DATE OF BIRTH: [birth year]  PATIENT ID: [id number]  TYPE OF STUDY: PSG  READING PHYSICIAN: Melvyn Novas, MD REFERRED BY: Rosemary Holms, MD SCORING TECHNICIAN: Margaretann Loveless, RPSGT   HISTORY: Desiree James is a 71 year-old Female patient who was seen on 05-09-2022 and has a history of very recent ( 04-03-2022) pacemaker implantation, bradycardia, elevated BMI, Retrognathia, UPPP, status post gastric bypass 2003, HTN.  Past medical history :  ADDITIONAL INFORMATION: The Epworth Sleepiness Scale endorsed at at 7 /24 points (scores above or equal to 10 are suggestive of hypersomnolence). FSS endorsed at 29 /63 points. Height: 64 in Weight: 223 lb (BMI 38) Neck Size: 14 in  MEDICATIONS: Aspirin, Vitamin D, Colace, Ferrous Sulfate, Cozaar, Multiple Vitamins, Fish Oil, Crestor, Zoloft, Super B Complex TECHNICAL DESCRIPTION: A registered sleep technologist ( RPSGT) was in attendance for the duration of the recording. Data collection, scoring, video monitoring, and reporting were performed in compliance with the AASM Manual for the Scoring of Sleep and Associated Events; (Hypopnea is scored based on the criteria listed in Section VIII D. 1b in the AASM Manual V2.6 using a 4% oxygen desaturation rule or Hypopnea is scored based on the criteria listed in Section VIII D. 1a in the AASM Manual V2.6 using 3% oxygen desaturation and /or arousal rule).   SLEEP CONTINUITY AND SLEEP ARCHITECTURE: Lights-out was at 21:30: and lights-on at 05:12:, with 7.7 hours of recording time ( 427 m). Total sleep time ( TST) was 383.5 minutes with a slightly <85, &quot;decreased&quot;, if(@1 <95, &quot;normal&quot;, &quot;high&quot;))"decreased sleep efficiency at 83%.  Sleep latency was <5, "decreased", if(@1 <30, "normal", "increased"))'increased at 78.0 minutes. REM sleep  latency was <50, &quot;decreased&quot;, if(@1 <90, &quot;normal&quot;, &quot;increased&quot;))"increased at 208.5 minutes. Of the total sleep time, the percentage of stage N1 sleep was 0.7%, stage N2 sleep was 75%, stage N3 sleep was 15.9%, and REM sleep was 8.3% ( none in supine ) . There were 1 Stage R periods observed on this study night, 2 awakenings (i.e. transitions to Stage W from any sleep stage), and 17 total stage transitions. Wake after sleep onset (WASO) time accounted for 1 minute .  BODY POSITION: TST was divided between the following sleep positions: Sleep in supine was seen for 107 minutes (28%), in non-supine 276 minutes (72%); right 00 minutes (0%), left 276 minutes (72%), and prone sleep was 00 minutes (0%).   RESPIRATORY MONITORING:  Based on CMS criteria (using a 4% oxygen desaturation rule for scoring hypopneas), there were 0 apneas (0 obstructive; 0 central; 0 mixed), and 16 hypopneas.  Apnea index was 0.0/h. Hypopnea index was 2.5/h. The AHI (apnea-hypopnea index) was 2.5/h overall (2.2/h supine, 15/h non-supine; 15.0/h REM). There were 0 respiratory effort-related arousals (RERAs).  There were 0 respiratory effort-related arousals (RERAs). Mild- moderate snoring was seen during supine sleep.  OXIMETRY: Oxyhemoglobin Saturation Nadir during sleep was at 88%, from a mean saturation of 95%. Of the Total sleep time (TST) hypoxemia (<89%) was present for 0.0 minutes, or 0.0% of total sleep time.  LIMB MOVEMENTS: There were 0 periodic limb movements of sleep (0.0/hr). AROUSAL: There were 27 arousals in total, for an arousal index of 4 /hour. Of these, 3 were identified as respiratory-related arousals (0 /h), 0 were PLM-related arousals (0 /h), and 24 were non-specific arousals (4 /h). EEG: PSG EEG was of normal amplitude and frequency, with symmetric manifestation  of sleep stages. EKG: The electrocardiogram documented NR in paced rhythm. The average heart rate during sleep was 60 bpm.  The heart rate during sleep varied between a minimum of 59 bpm and a maximum of 72 bpm. AUDIO and VIDEO: some non-periodic leg movements, no complex movements.   IMPRESSION: 1) Sleep disordered breathing was present at such a mild degree that CPAP (or other intervention) is not indicated. Only during REM sleep was there a higher degree of sleep apnea present, and all REM sleep was in non- supine sleep position.  2) mild-moderate snoring in supine was unexpected finding in a patient with reported UPPP history.  3) unusual was the proportionally higher presence of N3, slow wave sleep. 360, &quot;Total sleep time was within normal limits&quot;, &quot;Total sleep time was normal&quot; ))  "Total sleep time was within normal limits at 383.5 minutes. <85, &quot;Sleep efficiency was decreased&quot;, if(@1 <95, &quot;Sleep efficiency was normal&quot;, &quot;Sleep efficiency was high&quot;))"Sleep efficiency was slightly decreased at 83 %.  RECOMMENDATIONS: Insignificant degree of sleep apnea overall, no physiological sleep dysfunction was seen. I recommend to avoid sleeping supine.  If snoring is of concern, a dental device can be tried to reduce snoring alone.   [name], MD

## 2022-07-28 NOTE — Progress Notes (Signed)
No significant apnea was found, nor PLMs or any other sleep disorder. Desiree James slept uninterrupted and snored mildly.  I recommend to avoid supine sleep and to try to lose weight.  A follow up is not needed.

## 2022-07-31 ENCOUNTER — Encounter: Payer: Self-pay | Admitting: Neurology

## 2022-08-08 ENCOUNTER — Encounter: Payer: Self-pay | Admitting: Cardiology

## 2022-08-08 DIAGNOSIS — Z45018 Encounter for adjustment and management of other part of cardiac pacemaker: Secondary | ICD-10-CM | POA: Insufficient documentation

## 2022-08-08 NOTE — Progress Notes (Signed)
No show

## 2022-08-09 ENCOUNTER — Ambulatory Visit: Payer: Medicare HMO | Admitting: Cardiology

## 2022-08-09 DIAGNOSIS — Z45018 Encounter for adjustment and management of other part of cardiac pacemaker: Secondary | ICD-10-CM

## 2022-08-09 DIAGNOSIS — I495 Sick sinus syndrome: Secondary | ICD-10-CM

## 2022-08-09 DIAGNOSIS — Z950 Presence of cardiac pacemaker: Secondary | ICD-10-CM

## 2022-08-11 DIAGNOSIS — I10 Essential (primary) hypertension: Secondary | ICD-10-CM | POA: Diagnosis not present

## 2022-08-11 DIAGNOSIS — I495 Sick sinus syndrome: Secondary | ICD-10-CM | POA: Diagnosis not present

## 2022-08-12 LAB — BASIC METABOLIC PANEL
BUN/Creatinine Ratio: 17 (ref 12–28)
BUN: 13 mg/dL (ref 8–27)
CO2: 25 mmol/L (ref 20–29)
Calcium: 9.5 mg/dL (ref 8.7–10.3)
Chloride: 101 mmol/L (ref 96–106)
Creatinine, Ser: 0.78 mg/dL (ref 0.57–1.00)
Glucose: 94 mg/dL (ref 70–99)
Potassium: 4.1 mmol/L (ref 3.5–5.2)
Sodium: 144 mmol/L (ref 134–144)
eGFR: 82 mL/min/{1.73_m2} (ref >59)

## 2022-10-02 DIAGNOSIS — Z950 Presence of cardiac pacemaker: Secondary | ICD-10-CM | POA: Diagnosis not present

## 2022-10-02 DIAGNOSIS — I495 Sick sinus syndrome: Secondary | ICD-10-CM | POA: Diagnosis not present

## 2022-10-03 ENCOUNTER — Encounter: Payer: Self-pay | Admitting: Cardiology

## 2022-10-12 ENCOUNTER — Encounter: Payer: Self-pay | Admitting: Cardiology

## 2022-10-13 ENCOUNTER — Other Ambulatory Visit: Payer: Self-pay

## 2022-10-13 ENCOUNTER — Emergency Department (HOSPITAL_BASED_OUTPATIENT_CLINIC_OR_DEPARTMENT_OTHER): Payer: Medicare PPO | Admitting: Radiology

## 2022-10-13 ENCOUNTER — Emergency Department (HOSPITAL_BASED_OUTPATIENT_CLINIC_OR_DEPARTMENT_OTHER)
Admission: EM | Admit: 2022-10-13 | Discharge: 2022-10-13 | Disposition: A | Discharge status: Home / Self Care | Payer: Medicare PPO | Attending: Emergency Medicine | Admitting: Emergency Medicine

## 2022-10-13 DIAGNOSIS — Z7982 Long term (current) use of aspirin: Secondary | ICD-10-CM | POA: Diagnosis not present

## 2022-10-13 DIAGNOSIS — Y92410 Unspecified street and highway as the place of occurrence of the external cause: Secondary | ICD-10-CM | POA: Diagnosis not present

## 2022-10-13 DIAGNOSIS — R079 Chest pain, unspecified: Secondary | ICD-10-CM | POA: Diagnosis not present

## 2022-10-13 DIAGNOSIS — S20212A Contusion of left front wall of thorax, initial encounter: Secondary | ICD-10-CM

## 2022-10-13 DIAGNOSIS — Z041 Encounter for examination and observation following transport accident: Secondary | ICD-10-CM | POA: Diagnosis not present

## 2022-10-13 LAB — CBC WITH DIFFERENTIAL/PLATELET
Abs Immature Granulocytes: 0.01 10*3/uL (ref 0.00–0.07)
Basophils Absolute: 0.1 10*3/uL (ref 0.0–0.1)
Basophils Relative: 1 %
Eosinophils Absolute: 0.3 10*3/uL (ref 0.0–0.5)
Eosinophils Relative: 3 %
HCT: 40.3 % (ref 36.0–46.0)
Hemoglobin: 13.6 g/dL (ref 12.0–15.0)
Immature Granulocytes: 0 %
Lymphocytes Relative: 24 %
Lymphs Abs: 2.2 10*3/uL (ref 0.7–4.0)
MCH: 28.9 pg (ref 26.0–34.0)
MCHC: 33.7 g/dL (ref 30.0–36.0)
MCV: 85.7 fL (ref 80.0–100.0)
Monocytes Absolute: 0.6 10*3/uL (ref 0.1–1.0)
Monocytes Relative: 7 %
Neutro Abs: 5.8 10*3/uL (ref 1.7–7.7)
Neutrophils Relative %: 65 %
Platelets: 281 10*3/uL (ref 150–400)
RBC: 4.7 MIL/uL (ref 3.87–5.11)
RDW: 12.9 % (ref 11.5–15.5)
WBC: 8.8 10*3/uL (ref 4.0–10.5)
nRBC: 0 % (ref 0.0–0.2)

## 2022-10-13 LAB — BASIC METABOLIC PANEL
Anion gap: 8 (ref 5–15)
BUN: 19 mg/dL (ref 8–23)
CO2: 26 mmol/L (ref 22–32)
Calcium: 10.2 mg/dL (ref 8.9–10.3)
Chloride: 104 mmol/L (ref 98–111)
Creatinine, Ser: 0.81 mg/dL (ref 0.44–1.00)
GFR, Estimated: >60 mL/min (ref >60)
Glucose, Bld: 98 mg/dL (ref 70–99)
Potassium: 4.3 mmol/L (ref 3.5–5.1)
Sodium: 138 mmol/L (ref 135–145)

## 2022-10-13 LAB — TROPONIN I (HIGH SENSITIVITY): Troponin I (High Sensitivity): 4 ng/L (ref <18)

## 2022-10-13 MED ORDER — METHOCARBAMOL 500 MG PO TABS: 500.0000 mg | ORAL_TABLET | Freq: Three times a day (TID) | ORAL | 0 refills | Status: AC | PRN

## 2022-10-13 MED ORDER — TRAMADOL HCL 50 MG PO TABS: 50.0000 mg | ORAL_TABLET | Freq: Four times a day (QID) | ORAL | 0 refills | Status: AC | PRN
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
## 2022-10-13 NOTE — ED Notes (Signed)
Pt verbalized understanding of d/c instructions, meds, and followup care. Denies questions. VSS, no distress noted. Steady gait to exit with all belongings.  ?

## 2022-10-16 ENCOUNTER — Ambulatory Visit: Payer: Medicare PPO | Admitting: Cardiology

## 2022-10-16 ENCOUNTER — Encounter: Payer: Self-pay | Admitting: Cardiology

## 2022-10-16 VITALS — BP 144/73 | HR 60 | Resp 16 | Ht 64.0 in | Wt 228.0 lb

## 2022-10-16 DIAGNOSIS — I495 Sick sinus syndrome: Secondary | ICD-10-CM | POA: Diagnosis not present

## 2022-10-16 DIAGNOSIS — I480 Paroxysmal atrial fibrillation: Secondary | ICD-10-CM

## 2022-10-16 DIAGNOSIS — I10 Essential (primary) hypertension: Secondary | ICD-10-CM | POA: Diagnosis not present

## 2022-10-16 MED ORDER — METOPROLOL SUCCINATE ER 25 MG PO TB24: 25.0000 mg | ORAL_TABLET | Freq: Every day | ORAL | 3 refills | Status: DC

## 2022-10-16 MED ORDER — LOSARTAN POTASSIUM 100 MG PO TABS: 100.0000 mg | ORAL_TABLET | Freq: Every day | ORAL | 3 refills | Status: DC

## 2022-10-16 MED ORDER — RIVAROXABAN 20 MG PO TABS: 20.0000 mg | ORAL_TABLET | Freq: Every day | ORAL | 3 refills | Status: DC
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
## 2022-10-18 ENCOUNTER — Telehealth: Payer: Self-pay

## 2022-10-18 ENCOUNTER — Ambulatory Visit: Payer: Medicare PPO | Admitting: Cardiology
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
## 2022-10-19 ENCOUNTER — Telehealth: Payer: Self-pay
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
## 2022-10-23 ENCOUNTER — Telehealth: Payer: Self-pay
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
## 2023-01-01 DIAGNOSIS — I495 Sick sinus syndrome: Secondary | ICD-10-CM | POA: Diagnosis not present

## 2023-01-01 DIAGNOSIS — Z45018 Encounter for adjustment and management of other part of cardiac pacemaker: Secondary | ICD-10-CM | POA: Diagnosis not present

## 2023-01-29 DIAGNOSIS — F418 Other specified anxiety disorders: Secondary | ICD-10-CM | POA: Diagnosis not present

## 2023-01-29 DIAGNOSIS — I10 Essential (primary) hypertension: Secondary | ICD-10-CM | POA: Diagnosis not present

## 2023-01-29 DIAGNOSIS — Z79899 Other long term (current) drug therapy: Secondary | ICD-10-CM | POA: Diagnosis not present

## 2023-02-15 DIAGNOSIS — I10 Essential (primary) hypertension: Secondary | ICD-10-CM | POA: Diagnosis not present

## 2023-02-15 DIAGNOSIS — I5189 Other ill-defined heart diseases: Secondary | ICD-10-CM | POA: Diagnosis not present

## 2023-02-15 DIAGNOSIS — F418 Other specified anxiety disorders: Secondary | ICD-10-CM | POA: Diagnosis not present

## 2023-02-15 DIAGNOSIS — I480 Paroxysmal atrial fibrillation: Secondary | ICD-10-CM | POA: Diagnosis not present

## 2023-02-15 DIAGNOSIS — R001 Bradycardia, unspecified: Secondary | ICD-10-CM | POA: Diagnosis not present

## 2023-02-15 DIAGNOSIS — Z1331 Encounter for screening for depression: Secondary | ICD-10-CM | POA: Diagnosis not present

## 2023-02-15 DIAGNOSIS — E669 Obesity, unspecified: Secondary | ICD-10-CM | POA: Diagnosis not present

## 2023-02-15 DIAGNOSIS — Z0000 Encounter for general adult medical examination without abnormal findings: Secondary | ICD-10-CM | POA: Diagnosis not present

## 2023-02-15 DIAGNOSIS — Z1339 Encounter for screening examination for other mental health and behavioral disorders: Secondary | ICD-10-CM | POA: Diagnosis not present

## 2023-02-15 DIAGNOSIS — F331 Major depressive disorder, recurrent, moderate: Secondary | ICD-10-CM | POA: Diagnosis not present

## 2023-02-15 DIAGNOSIS — Z980 Intestinal bypass and anastomosis status: Secondary | ICD-10-CM | POA: Diagnosis not present

## 2023-02-15 DIAGNOSIS — R82998 Other abnormal findings in urine: Secondary | ICD-10-CM | POA: Diagnosis not present

## 2023-04-02 DIAGNOSIS — I495 Sick sinus syndrome: Secondary | ICD-10-CM | POA: Diagnosis not present

## 2023-04-02 DIAGNOSIS — Z45018 Encounter for adjustment and management of other part of cardiac pacemaker: Secondary | ICD-10-CM | POA: Diagnosis not present

## 2023-04-16 ENCOUNTER — Encounter: Payer: Self-pay | Admitting: Cardiology

## 2023-04-16 ENCOUNTER — Ambulatory Visit: Payer: Medicare PPO | Admitting: Cardiology

## 2023-04-16 VITALS — BP 188/90 | HR 60 | Resp 16 | Ht 64.0 in | Wt 231.0 lb

## 2023-04-16 DIAGNOSIS — I495 Sick sinus syndrome: Secondary | ICD-10-CM

## 2023-04-16 DIAGNOSIS — I10 Essential (primary) hypertension: Secondary | ICD-10-CM | POA: Diagnosis not present

## 2023-04-16 DIAGNOSIS — I480 Paroxysmal atrial fibrillation: Secondary | ICD-10-CM

## 2023-04-16 NOTE — Progress Notes (Signed)
Follow up visit  Subjective:   Desiree James, female    DOB: [birth year], 72 y.o.   MRN: [id number]    HPI  Chief Complaint  Patient presents with   Sinus node dysfunction   Hypertension   Follow-up    6 month   Atrial Fibrillation    72 y.o. Caucasian female with hypertension, hyperlipidemia, sinus node dysfunction- now s/p pacemaker placement  Patient has been under stress with regards to her husband's health. Blood pressure is elevated. She has not taken her blood pressure medications today.    Current Outpatient Medications:    Calcium-Magnesium-Vitamin D (SUPER CAL-MAG-D PO), Take 1 tablet by mouth daily., Disp: , Rfl:    docusate sodium (COLACE) 100 MG capsule, Take 100 mg by mouth daily., Disp: , Rfl:    ezetimibe (ZETIA) 10 MG tablet, Take 10 mg by mouth daily., Disp: , Rfl:    ferrous sulfate 325 (65 FE) MG EC tablet, Take 325 mg by mouth daily., Disp: , Rfl:    losartan (COZAAR) 100 MG tablet, Take 1 tablet (100 mg total) by mouth daily., Disp: 90 tablet, Rfl: 3   methocarbamol (ROBAXIN) 500 MG tablet, Take 1 tablet (500 mg total) by mouth every 8 (eight) hours as needed for muscle spasms., Disp: 20 tablet, Rfl: 0   metoprolol succinate (TOPROL-XL) 25 MG 24 hr tablet, Take 1 tablet (25 mg total) by mouth daily. Take with or immediately following a meal., Disp: 90 tablet, Rfl: 3   Multiple Vitamin (MULTI VITAMIN) TABS, Take 1 tablet by mouth daily., Disp: , Rfl:    Multiple Vitamins-Minerals (PRESERVISION AREDS 2+MULTI VIT) CAPS, Take 2 capsules by mouth daily., Disp: , Rfl:    Omega-3 Fatty Acids (FISH OIL) 1200 MG CAPS, Take 1,200 mg by mouth daily., Disp: , Rfl:    rivaroxaban (XARELTO) 20 MG TABS tablet, Take 1 tablet (20 mg total) by mouth daily with supper., Disp: 90 tablet, Rfl: 3   rosuvastatin (CRESTOR) 10 MG tablet, Take 10 mg by mouth 2 (two) times a week., Disp: , Rfl:    sertraline (ZOLOFT) 50 MG tablet, Take 50 mg by mouth daily., Disp: , Rfl:     SUPER B COMPLEX/C PO, Take 1 tablet by mouth daily., Disp: , Rfl:    traMADol (ULTRAM) 50 MG tablet, Take 1 tablet (50 mg total) by mouth every 6 (six) hours as needed., Disp: 15 tablet, Rfl: 0   Cardiovascular & other pertient studies:  Reviewed external labs and tests, independently interpreted  EKG 04/16/2023: Atrial paced rhythm LVH Nonspecific T-abnormality  Remote dual-chamber pacemaker transmission 04/02/2023: Longevity 4 years 8 months.  AP 98 %, VP 1.0%.  Lead impedance and thresholds are normal.  90 AMS episodes.  EGM = brief A-fib, longest 6 minutes.  AT/AF burden <1%.  Normal pacemaker function.   Remote dual-chamber pacemaker transmission 10/02/2022: Longevity 5 years.  AP 96%, VP 1.1%.  Lead impedance and thresholds are normal.  Frequent AMS episodes, total 102.  EGM = brief A-fib.  AT/AF burden <1%.  Normal pacemaker function.   Pacemaker placement 04/03/2022: 1. Successful implantation of a [employer] Jude dual-chamber pacemaker for symptomatic bradycardia due to sinus node dysfunction  2. No early apparent complications.     Echocardiogram 02/02/2022:  Normal LV systolic function with EF 55%. Left ventricle cavity is normal  in size. Normal global wall motion. Normal diastolic filling pattern.  Calculated EF 55%.  Left atrial cavity is moderately dilated at 4.6 cm.  Structurally  normal mitral valve.  Mild (Grade I) mitral regurgitation.  Structurally normal tricuspid valve.  Moderate tricuspid regurgitation.  Mild pulmonary hypertension. RVSP measures 34 mmHg.  Structurally normal pulmonic valve.  Moderate pulmonic regurgitation.  Marked sinus bradycardia throughout the exam.  Recent labs: 10/13/2022: Glucose 98, BUN/Cr 19/0.81. EGFR >60. Na/K 138/4.3. Rest of the CMP normal H/H 13/40. MCV 85. Platelets 281  03/16/2022: Glucose 102, BUN/Cr 17/0.8. EGFR 80. Na/K 141/4.6.  H/H 12/38. MCV 82. Platelets 267   Review of Systems  Cardiovascular:  Negative for chest pain,  dyspnea on exertion, leg swelling, palpitations and syncope.         Vitals:   04/16/23 0943  BP: (!) 178/92  Pulse: 63  Resp: 16  SpO2: 96%    Body mass index is 39.65 kg/m. Filed Weights   04/16/23 0943  Weight: 231 lb (104.8 kg)     Objective:   Physical Exam Vitals and nursing note reviewed.  Constitutional:      General: She is not in acute distress. Neck:     Vascular: No JVD.  Cardiovascular:     Rate and Rhythm: Normal rate and regular rhythm.     Heart sounds: Normal heart sounds. No murmur heard.    Comments: Well healed pacemaker pocket scar  Pulmonary:     Effort: Pulmonary effort is normal.     Breath sounds: Normal breath sounds. No wheezing or rales.  Musculoskeletal:     Right lower leg: No edema.     Left lower leg: No edema.             Visit diagnoses:   ICD-10-CM   1. PAF (paroxysmal atrial fibrillation) (HCC)  I48.0 EKG 12-Lead       Orders Placed This Encounter  Procedures   EKG 12-Lead     Assessment & Recommendations:   72 y.o. Caucasian female with hypertension, hyperlipidemia, sinus node dysfunction, s/p pacemaker placement, PAF  PAF: <1% Afib burden. CHA2DS2VASc score 3, annual stroke risk 3.2%. Continue metoprolol succinate 25 mg daily, Eliquis 5 mg bid. Sinus node dysfunction:  Sinus node dysfunction: Now s/p pacemaker placement  Hypertension: Uncontrolled. Stress likely contributing.  Continue metoprolol succinate 25 mg daily, losartan 100 mg daily.  Keep BP log.  F/u in 1 week for hypertension.    Elder Negus, MD Pager: [phone] Office: [phone]

## 2023-04-20 ENCOUNTER — Encounter: Payer: Self-pay | Admitting: Cardiology

## 2023-04-20 ENCOUNTER — Ambulatory Visit: Payer: Medicare PPO | Admitting: Cardiology

## 2023-04-20 VITALS — BP 185/100 | HR 78 | Resp 16 | Ht 64.0 in | Wt 231.8 lb

## 2023-04-20 DIAGNOSIS — I10 Essential (primary) hypertension: Secondary | ICD-10-CM

## 2023-04-20 MED ORDER — LABETALOL HCL 200 MG PO TABS: 200.0000 mg | ORAL_TABLET | Freq: Two times a day (BID) | ORAL | 3 refills | Status: DC

## 2023-04-20 NOTE — Progress Notes (Signed)
Follow up visit  Subjective:   Desiree James, female    DOB: 1951-03-03, 72 y.o.   MRN: [id number]    HPI  Chief Complaint  Patient presents with   Hypertension   PAF (paroxysmal atrial fibrillation) (HCC)    72 y.o. Caucasian female with hypertension, hyperlipidemia, sinus node dysfunction- now s/p pacemaker placement  Patient remains been under a lot of stress with regards to her husband's health. Blood pressure remains elevated. Compared home blood pressure monitor with ours today.    Current Outpatient Medications:    Calcium-Magnesium-Vitamin D (SUPER CAL-MAG-D PO), Take 1 tablet by mouth daily., Disp: , Rfl:    docusate sodium (COLACE) 100 MG capsule, Take 100 mg by mouth daily., Disp: , Rfl:    ezetimibe (ZETIA) 10 MG tablet, Take 10 mg by mouth daily., Disp: , Rfl:    ferrous sulfate 325 (65 FE) MG EC tablet, Take 325 mg by mouth daily., Disp: , Rfl:    losartan (COZAAR) 100 MG tablet, Take 1 tablet (100 mg total) by mouth daily., Disp: 90 tablet, Rfl: 3   methocarbamol (ROBAXIN) 500 MG tablet, Take 1 tablet (500 mg total) by mouth every 8 (eight) hours as needed for muscle spasms., Disp: 20 tablet, Rfl: 0   metoprolol succinate (TOPROL-XL) 25 MG 24 hr tablet, Take 1 tablet (25 mg total) by mouth daily. Take with or immediately following a meal., Disp: 90 tablet, Rfl: 3   Multiple Vitamin (MULTI VITAMIN) TABS, Take 1 tablet by mouth daily., Disp: , Rfl:    Omega-3 Fatty Acids (FISH OIL) 1200 MG CAPS, Take 1,200 mg by mouth daily., Disp: , Rfl:    rivaroxaban (XARELTO) 20 MG TABS tablet, Take 1 tablet (20 mg total) by mouth daily with supper., Disp: 90 tablet, Rfl: 3   rosuvastatin (CRESTOR) 10 MG tablet, Take 10 mg by mouth 2 (two) times a week., Disp: , Rfl:    sertraline (ZOLOFT) 50 MG tablet, Take 50 mg by mouth daily., Disp: , Rfl:    SUPER B COMPLEX/C PO, Take 1 tablet by mouth daily., Disp: , Rfl:    traMADol (ULTRAM) 50 MG tablet, Take 1 tablet (50 mg total) by  mouth every 6 (six) hours as needed., Disp: 15 tablet, Rfl: 0   Cardiovascular & other pertient studies:  Reviewed external labs and tests, independently interpreted  EKG 04/16/2023: Atrial paced rhythm LVH Nonspecific T-abnormality  Remote dual-chamber pacemaker transmission 04/02/2023: Longevity 4 years 8 months.  AP 98 %, VP 1.0%.  Lead impedance and thresholds are normal.  90 AMS episodes.  EGM = brief A-fib, longest 6 minutes.  AT/AF burden <1%.  Normal pacemaker function.   Remote dual-chamber pacemaker transmission 10/02/2022: Longevity 5 years.  AP 96%, VP 1.1%.  Lead impedance and thresholds are normal.  Frequent AMS episodes, total 102.  EGM = brief A-fib.  AT/AF burden <1%.  Normal pacemaker function.   Pacemaker placement 04/03/2022: 1. Successful implantation of a [employer] Jude dual-chamber pacemaker for symptomatic bradycardia due to sinus node dysfunction  2. No early apparent complications.     Echocardiogram 02/02/2022:  Normal LV systolic function with EF 55%. Left ventricle cavity is normal  in size. Normal global wall motion. Normal diastolic filling pattern.  Calculated EF 55%.  Left atrial cavity is moderately dilated at 4.6 cm.  Structurally normal mitral valve.  Mild (Grade I) mitral regurgitation.  Structurally normal tricuspid valve.  Moderate tricuspid regurgitation.  Mild pulmonary hypertension. RVSP measures 34 mmHg.  Structurally normal pulmonic valve.  Moderate pulmonic regurgitation.  Marked sinus bradycardia throughout the exam.  Recent labs: 10/13/2022: Glucose 98, BUN/Cr 19/0.81. EGFR >60. Na/K 138/4.3. Rest of the CMP normal H/H 13/40. MCV 85. Platelets 281  03/16/2022: Glucose 102, BUN/Cr 17/0.8. EGFR 80. Na/K 141/4.6.  H/H 12/38. MCV 82. Platelets 267   Review of Systems  Cardiovascular:  Negative for chest pain, dyspnea on exertion, leg swelling, palpitations and syncope.         Vitals:   04/20/23 1507  BP: (!) 185/100  Pulse: 78  Resp:  16  SpO2: 96%    Body mass index is 39.79 kg/m. Filed Weights   04/20/23 1507  Weight: 231 lb 12.8 oz (105.1 kg)     Objective:   Physical Exam Vitals and nursing note reviewed.  Constitutional:      General: She is not in acute distress. Neck:     Vascular: No JVD.  Cardiovascular:     Rate and Rhythm: Normal rate and regular rhythm.     Heart sounds: Normal heart sounds. No murmur heard.    Comments: Well healed pacemaker pocket scar  Pulmonary:     Effort: Pulmonary effort is normal.     Breath sounds: Normal breath sounds. No wheezing or rales.  Musculoskeletal:     Right lower leg: No edema.     Left lower leg: No edema.             Visit diagnoses:   ICD-10-CM   1. Essential hypertension  I10         Orders Placed This Encounter  Procedures   EKG 12-Lead     Assessment & Recommendations:   72 y.o. Caucasian female with hypertension, hyperlipidemia, sinus node dysfunction, s/p pacemaker placement, PAF  Hypertension: Uncontrolled. Stress likely contributing.  Changed metoprolol succinate 25 mg daily to labetalol 200 mg bid. Continue losartan 100 mg daily.  Keep BP log.  F/u in 3-4 weeks for hypertension.    Elder Negus, MD Pager: [phone] Office: [phone]

## 2023-05-05 ENCOUNTER — Other Ambulatory Visit: Payer: Self-pay | Admitting: Cardiology

## 2023-05-24 ENCOUNTER — Encounter: Payer: Self-pay | Admitting: Cardiology

## 2023-05-24 ENCOUNTER — Ambulatory Visit: Payer: Medicare PPO | Admitting: Cardiology

## 2023-05-24 VITALS — BP 118/63 | HR 68 | Resp 17 | Ht 64.0 in | Wt 230.0 lb

## 2023-05-24 DIAGNOSIS — I10 Essential (primary) hypertension: Secondary | ICD-10-CM | POA: Diagnosis not present

## 2023-05-24 DIAGNOSIS — I480 Paroxysmal atrial fibrillation: Secondary | ICD-10-CM | POA: Diagnosis not present

## 2023-05-24 DIAGNOSIS — I495 Sick sinus syndrome: Secondary | ICD-10-CM

## 2023-05-24 MED ORDER — RIVAROXABAN 20 MG PO TABS: 20.0000 mg | ORAL_TABLET | Freq: Every day | ORAL | 3 refills | Status: DC

## 2023-05-24 MED ORDER — LABETALOL HCL 200 MG PO TABS: 200.0000 mg | ORAL_TABLET | Freq: Two times a day (BID) | ORAL | 3 refills | Status: DC

## 2023-05-24 MED ORDER — EZETIMIBE 10 MG PO TABS: 10.0000 mg | ORAL_TABLET | Freq: Every day | ORAL | 3 refills | Status: DC

## 2023-05-24 MED ORDER — LOSARTAN POTASSIUM 100 MG PO TABS
100.0000 mg | ORAL_TABLET | Freq: Every day | ORAL | 3 refills | Status: DC
Start: 2023-05-24 — End: 2023-11-22

## 2023-05-24 NOTE — Progress Notes (Signed)
Follow up visit  Subjective:   Desiree James, female    DOB: June 18, 1951, 72 y.o.   MRN: [id number]    HPI  Chief Complaint  Patient presents with   Hypertension   Follow-up    3-4 week    72 y.o. Caucasian female with hypertension, hyperlipidemia, sinus node dysfunction- now s/p pacemaker placement  Blood pressure is very well-controlled on current medications now.  She has no complaints today.    Current Outpatient Medications:    Calcium-Magnesium-Vitamin D (SUPER CAL-MAG-D PO), Take 1 tablet by mouth daily., Disp: , Rfl:    docusate sodium (COLACE) 100 MG capsule, Take 100 mg by mouth daily., Disp: , Rfl:    ezetimibe (ZETIA) 10 MG tablet, Take 10 mg by mouth daily., Disp: , Rfl:    ferrous sulfate 325 (65 FE) MG EC tablet, Take 325 mg by mouth daily., Disp: , Rfl:    labetalol (NORMODYNE) 200 MG tablet, TAKE 1 TABLET BY MOUTH TWICE A DAY, Disp: 180 tablet, Rfl: 1   losartan (COZAAR) 100 MG tablet, Take 1 tablet (100 mg total) by mouth daily., Disp: 90 tablet, Rfl: 3   methocarbamol (ROBAXIN) 500 MG tablet, Take 1 tablet (500 mg total) by mouth every 8 (eight) hours as needed for muscle spasms., Disp: 20 tablet, Rfl: 0   Multiple Vitamin (MULTI VITAMIN) TABS, Take 1 tablet by mouth daily., Disp: , Rfl:    Omega-3 Fatty Acids (FISH OIL) 1200 MG CAPS, Take 1,200 mg by mouth daily., Disp: , Rfl:    rivaroxaban (XARELTO) 20 MG TABS tablet, Take 1 tablet (20 mg total) by mouth daily with supper., Disp: 90 tablet, Rfl: 3   rosuvastatin (CRESTOR) 10 MG tablet, Take 10 mg by mouth 2 (two) times a week., Disp: , Rfl:    sertraline (ZOLOFT) 50 MG tablet, Take 50 mg by mouth daily., Disp: , Rfl:    SUPER B COMPLEX/C PO, Take 1 tablet by mouth daily., Disp: , Rfl:    traMADol (ULTRAM) 50 MG tablet, Take 1 tablet (50 mg total) by mouth every 6 (six) hours as needed., Disp: 15 tablet, Rfl: 0   Cardiovascular & other pertient studies:  Reviewed external labs and tests, independently  interpreted  EKG 04/16/2023: Atrial paced rhythm LVH Nonspecific T-abnormality  Remote dual-chamber pacemaker transmission 04/02/2023: Longevity 4 years 8 months.  AP 98 %, VP 1.0%.  Lead impedance and thresholds are normal.  90 AMS episodes.  EGM = brief A-fib, longest 6 minutes.  AT/AF burden <1%.  Normal pacemaker function.   Remote dual-chamber pacemaker transmission 10/02/2022: Longevity 5 years.  AP 96%, VP 1.1%.  Lead impedance and thresholds are normal.  Frequent AMS episodes, total 102.  EGM = brief A-fib.  AT/AF burden <1%.  Normal pacemaker function.   Pacemaker placement 04/03/2022: 1. Successful implantation of a [employer] Jude dual-chamber pacemaker for symptomatic bradycardia due to sinus node dysfunction  2. No early apparent complications.     Echocardiogram 02/02/2022:  Normal LV systolic function with EF 55%. Left ventricle cavity is normal  in size. Normal global wall motion. Normal diastolic filling pattern.  Calculated EF 55%.  Left atrial cavity is moderately dilated at 4.6 cm.  Structurally normal mitral valve.  Mild (Grade I) mitral regurgitation.  Structurally normal tricuspid valve.  Moderate tricuspid regurgitation.  Mild pulmonary hypertension. RVSP measures 34 mmHg.  Structurally normal pulmonic valve.  Moderate pulmonic regurgitation.  Marked sinus bradycardia throughout the exam.  Recent labs: 10/13/2022: Glucose 98,  BUN/Cr 19/0.81. EGFR >60. Na/K 138/4.3. Rest of the CMP normal H/H 13/40. MCV 85. Platelets 281  03/16/2022: Glucose 102, BUN/Cr 17/0.8. EGFR 80. Na/K 141/4.6.  H/H 12/38. MCV 82. Platelets 267   Review of Systems  Cardiovascular:  Negative for chest pain, dyspnea on exertion, leg swelling, palpitations and syncope.         Vitals:   05/24/23 1030  BP: 118/63  Pulse: 68  Resp: 17  SpO2: 96%     Body mass index is 39.48 kg/m. Filed Weights   05/24/23 1030  Weight: 230 lb (104.3 kg)      Objective:   Physical Exam Vitals and  nursing note reviewed.  Constitutional:      General: She is not in acute distress. Neck:     Vascular: No JVD.  Cardiovascular:     Rate and Rhythm: Normal rate and regular rhythm.     Heart sounds: Normal heart sounds. No murmur heard.    Comments: Well healed pacemaker pocket scar  Pulmonary:     Effort: Pulmonary effort is normal.     Breath sounds: Normal breath sounds. No wheezing or rales.  Musculoskeletal:     Right lower leg: No edema.     Left lower leg: No edema.             Visit diagnoses:   ICD-10-CM   1. Essential hypertension  I10 losartan (COZAAR) 100 MG tablet    2. PAF (paroxysmal atrial fibrillation) (HCC)  I48.0     3. Sinus node dysfunction (HCC)  I49.5          No orders of the defined types were placed in this encounter.    Assessment & Recommendations:   72 y.o. Caucasian female with hypertension, hyperlipidemia, sinus node dysfunction, s/p pacemaker placement, PAF  Hypertension: Now well-controlled. Continue labetalol 200 mg twice daily, losartan 100 mg daily, refilled today.    PAF: <1% Afib burden. CHA2DS2VASc score 3, annual stroke risk 3.2%. Continue Xarelto 20 mg daily.   Sinus node dysfunction: Now s/p pacemaker placement  F/u in 6 months   Elder Negus, MD Pager: [phone] Office: [phone]

## 2023-05-30 DIAGNOSIS — Z23 Encounter for immunization: Secondary | ICD-10-CM | POA: Diagnosis not present

## 2023-07-11 DIAGNOSIS — J0100 Acute maxillary sinusitis, unspecified: Secondary | ICD-10-CM | POA: Diagnosis not present

## 2023-07-11 DIAGNOSIS — R059 Cough, unspecified: Secondary | ICD-10-CM | POA: Diagnosis not present

## 2023-07-11 DIAGNOSIS — J029 Acute pharyngitis, unspecified: Secondary | ICD-10-CM | POA: Diagnosis not present

## 2023-07-11 DIAGNOSIS — R5383 Other fatigue: Secondary | ICD-10-CM | POA: Diagnosis not present

## 2023-07-11 DIAGNOSIS — J3489 Other specified disorders of nose and nasal sinuses: Secondary | ICD-10-CM | POA: Diagnosis not present

## 2023-07-11 DIAGNOSIS — Z1152 Encounter for screening for COVID-19: Secondary | ICD-10-CM | POA: Diagnosis not present

## 2023-07-18 ENCOUNTER — Ambulatory Visit: Payer: Medicare PPO | Admitting: Cardiology

## 2023-07-22 DIAGNOSIS — Z6837 Body mass index (BMI) 37.0-37.9, adult: Secondary | ICD-10-CM | POA: Diagnosis not present

## 2023-07-22 DIAGNOSIS — R197 Diarrhea, unspecified: Secondary | ICD-10-CM | POA: Diagnosis not present

## 2023-07-22 DIAGNOSIS — E860 Dehydration: Secondary | ICD-10-CM | POA: Diagnosis not present

## 2023-07-22 DIAGNOSIS — R42 Dizziness and giddiness: Secondary | ICD-10-CM | POA: Diagnosis not present

## 2023-07-22 DIAGNOSIS — I959 Hypotension, unspecified: Secondary | ICD-10-CM | POA: Diagnosis not present

## 2023-07-27 DIAGNOSIS — I480 Paroxysmal atrial fibrillation: Secondary | ICD-10-CM | POA: Diagnosis not present

## 2023-07-27 DIAGNOSIS — R051 Acute cough: Secondary | ICD-10-CM | POA: Diagnosis not present

## 2023-07-27 DIAGNOSIS — I10 Essential (primary) hypertension: Secondary | ICD-10-CM | POA: Diagnosis not present

## 2023-07-27 DIAGNOSIS — J40 Bronchitis, not specified as acute or chronic: Secondary | ICD-10-CM | POA: Diagnosis not present

## 2023-08-19 ENCOUNTER — Emergency Department (HOSPITAL_BASED_OUTPATIENT_CLINIC_OR_DEPARTMENT_OTHER): Payer: Medicare PPO

## 2023-08-19 ENCOUNTER — Other Ambulatory Visit: Payer: Self-pay

## 2023-08-19 ENCOUNTER — Encounter (HOSPITAL_BASED_OUTPATIENT_CLINIC_OR_DEPARTMENT_OTHER): Payer: Self-pay

## 2023-08-19 ENCOUNTER — Emergency Department (HOSPITAL_BASED_OUTPATIENT_CLINIC_OR_DEPARTMENT_OTHER)
Admission: EM | Admit: 2023-08-19 | Discharge: 2023-08-19 | Disposition: A | Discharge status: Home / Self Care | Payer: Medicare PPO | Attending: Emergency Medicine | Admitting: Emergency Medicine

## 2023-08-19 DIAGNOSIS — R935 Abnormal findings on diagnostic imaging of other abdominal regions, including retroperitoneum: Secondary | ICD-10-CM | POA: Diagnosis not present

## 2023-08-19 DIAGNOSIS — K769 Liver disease, unspecified: Secondary | ICD-10-CM | POA: Diagnosis not present

## 2023-08-19 DIAGNOSIS — I10 Essential (primary) hypertension: Secondary | ICD-10-CM | POA: Insufficient documentation

## 2023-08-19 DIAGNOSIS — R0602 Shortness of breath: Secondary | ICD-10-CM | POA: Diagnosis not present

## 2023-08-19 DIAGNOSIS — R109 Unspecified abdominal pain: Secondary | ICD-10-CM | POA: Insufficient documentation

## 2023-08-19 DIAGNOSIS — Z79899 Other long term (current) drug therapy: Secondary | ICD-10-CM | POA: Insufficient documentation

## 2023-08-19 DIAGNOSIS — R197 Diarrhea, unspecified: Secondary | ICD-10-CM | POA: Diagnosis not present

## 2023-08-19 DIAGNOSIS — K838 Other specified diseases of biliary tract: Secondary | ICD-10-CM | POA: Diagnosis not present

## 2023-08-19 DIAGNOSIS — K8020 Calculus of gallbladder without cholecystitis without obstruction: Secondary | ICD-10-CM | POA: Diagnosis not present

## 2023-08-19 LAB — URINALYSIS, ROUTINE W REFLEX MICROSCOPIC
Bilirubin Urine: NEGATIVE
Glucose, UA: NEGATIVE mg/dL
Hgb urine dipstick: NEGATIVE
Nitrite: NEGATIVE
Specific Gravity, Urine: 1.027 (ref 1.005–1.030)
WBC, UA: >50 WBC/hpf (ref 0–5)
pH: 6 (ref 5.0–8.0)

## 2023-08-19 LAB — CBC WITH DIFFERENTIAL/PLATELET
Abs Immature Granulocytes: 0.23 10*3/uL — ABNORMAL HIGH (ref 0.00–0.07)
Basophils Absolute: 0.1 10*3/uL (ref 0.0–0.1)
Basophils Relative: 0 %
Eosinophils Absolute: 0.4 10*3/uL (ref 0.0–0.5)
Eosinophils Relative: 3 %
HCT: 34.5 % — ABNORMAL LOW (ref 36.0–46.0)
Hemoglobin: 11.5 g/dL — ABNORMAL LOW (ref 12.0–15.0)
Immature Granulocytes: 2 %
Lymphocytes Relative: 15 %
Lymphs Abs: 1.8 10*3/uL (ref 0.7–4.0)
MCH: 28.1 pg (ref 26.0–34.0)
MCHC: 33.3 g/dL (ref 30.0–36.0)
MCV: 84.4 fL (ref 80.0–100.0)
Monocytes Absolute: 0.7 10*3/uL (ref 0.1–1.0)
Monocytes Relative: 6 %
Neutro Abs: 9 10*3/uL — ABNORMAL HIGH (ref 1.7–7.7)
Neutrophils Relative %: 74 %
Platelets: 369 10*3/uL (ref 150–400)
RBC: 4.09 MIL/uL (ref 3.87–5.11)
RDW: 14.1 % (ref 11.5–15.5)
WBC: 12.1 10*3/uL — ABNORMAL HIGH (ref 4.0–10.5)
nRBC: 0 % (ref 0.0–0.2)

## 2023-08-19 LAB — MAGNESIUM: Magnesium: 2 mg/dL (ref 1.7–2.4)

## 2023-08-19 LAB — TROPONIN I (HIGH SENSITIVITY): Troponin I (High Sensitivity): 7 ng/L (ref <18)

## 2023-08-19 LAB — COMPREHENSIVE METABOLIC PANEL
ALT: 8 U/L (ref 0–44)
AST: 14 U/L — ABNORMAL LOW (ref 15–41)
Albumin: 2.8 g/dL — ABNORMAL LOW (ref 3.5–5.0)
Alkaline Phosphatase: 87 U/L (ref 38–126)
Anion gap: 9 (ref 5–15)
BUN: 20 mg/dL (ref 8–23)
CO2: 26 mmol/L (ref 22–32)
Calcium: 8.4 mg/dL — ABNORMAL LOW (ref 8.9–10.3)
Chloride: 103 mmol/L (ref 98–111)
Creatinine, Ser: 0.76 mg/dL (ref 0.44–1.00)
GFR, Estimated: >60 mL/min (ref >60)
Glucose, Bld: 102 mg/dL — ABNORMAL HIGH (ref 70–99)
Potassium: 3.7 mmol/L (ref 3.5–5.1)
Sodium: 138 mmol/L (ref 135–145)
Total Bilirubin: 0.5 mg/dL (ref <1.2)
Total Protein: 5.7 g/dL — ABNORMAL LOW (ref 6.5–8.1)

## 2023-08-19 LAB — C DIFFICILE QUICK SCREEN W PCR REFLEX
C Diff antigen: POSITIVE — AB
C Diff toxin: NEGATIVE

## 2023-08-19 LAB — LIPASE, BLOOD: Lipase: <10 U/L — ABNORMAL LOW (ref 11–51)

## 2023-08-19 MED ORDER — SODIUM CHLORIDE 0.9 % IV BOLUS
1000.0000 mL | Freq: Once | INTRAVENOUS | Status: AC
  Administered 2023-08-19: 1000 mL via INTRAVENOUS

## 2023-08-19 MED ORDER — IOHEXOL 300 MG/ML  SOLN
100.0000 mL | Freq: Once | INTRAMUSCULAR | Status: AC | PRN
  Administered 2023-08-19: 100 mL via INTRAVENOUS

## 2023-08-19 NOTE — Discharge Instructions (Signed)
Follow-up with gastroenterology.  Follow-up your stool pathogen studies and talk with this about your primary care doctor.  Like I said there is some inflammation in your lower colon which I suspect is from your diarrheal process.  We have decided to hold on antibiotics at this time.  Please return if symptoms worsen.  As I stated you have some incidental finding on your liver that requires an MRI to further evaluate in the outpatient setting to make sure that this is not a cancerous process.  Suspect that this is a benign lesion however.  Talk with your primary care doctor about this as well.

## 2023-08-19 NOTE — ED Provider Notes (Signed)
[place] EMERGENCY DEPARTMENT AT [facility] Provider Note   CSN: [id number] Arrival date & time: 08/19/23  1547     History  Chief Complaint  Patient presents with   Diarrhea    Desiree James is a 72 y.o. female.  Patient here with diarrhea weight loss she has been having diarrhea for couple weeks after taking Augmentin.  She already had negative [name] test outpatient.  She has been having some heartburn symptoms as well.  She is worried about her heart.  She denies any weakness numbness tingling.  She still short of breath at times.  She states that she is having diarrhea 6 times a day taking Imodium.  She denies any nausea or vomiting.  She is concerned about dehydration.  History of hypertension.  Nothing makes it worse or better otherwise.  The history is provided by the patient.       Home Medications Prior to Admission medications   Medication Sig Start Date End Date Taking? Authorizing Provider  busPIRone (BUSPAR) 5 MG tablet Take 5 mg by mouth 2 (two) times daily. 04/23/23   [provider]  Calcium-Magnesium-Vitamin D (SUPER CAL-MAG-D PO) Take 1 tablet by mouth daily.    [provider]  docusate sodium (COLACE) 100 MG capsule Take 100 mg by mouth daily.    [provider]  ezetimibe (ZETIA) 10 MG tablet Take 1 tablet (10 mg total) by mouth daily. 05/24/23   Patwardhan, Anabel Bene, MD  ferrous sulfate 325 (65 FE) MG EC tablet Take 325 mg by mouth daily.    [provider]  labetalol (NORMODYNE) 200 MG tablet Take 1 tablet (200 mg total) by mouth 2 (two) times daily. 05/24/23   Patwardhan, Anabel Bene, MD  losartan (COZAAR) 100 MG tablet Take 1 tablet (100 mg total) by mouth daily. 05/24/23   Patwardhan, Anabel Bene, MD  methocarbamol (ROBAXIN) 500 MG tablet Take 1 tablet (500 mg total) by mouth every 8 (eight) hours as needed for muscle spasms. 10/13/22   Arby Barrette, MD  Multiple Vitamin (MULTI VITAMIN) TABS Take 1 tablet  by mouth daily.    [provider]  Omega-3 Fatty Acids (FISH OIL) 1200 MG CAPS Take 1,200 mg by mouth daily.    [provider]  rivaroxaban (XARELTO) 20 MG TABS tablet Take 1 tablet (20 mg total) by mouth daily with supper. 05/24/23   Patwardhan, Anabel Bene, MD  rosuvastatin (CRESTOR) 10 MG tablet Take 10 mg by mouth 2 (two) times a week. 11/25/21   [provider]  SUPER B COMPLEX/C PO Take 1 tablet by mouth daily.    [provider]  traMADol (ULTRAM) 50 MG tablet Take 1 tablet (50 mg total) by mouth every 6 (six) hours as needed. 10/13/22   Arby Barrette, MD      Allergies    Morphine    Review of Systems   Review of Systems  Physical Exam Updated Vital Signs BP (!) 101/50   Pulse 66   Temp 98 F (36.7 C) (Oral)   Resp 20   Ht 5\' 4"  (1.626 m)   Wt 97.5 kg   SpO2 100%   BMI 36.90 kg/m  Physical Exam Vitals and nursing note reviewed.  Constitutional:      General: She is not in acute distress.    Appearance: She is well-developed. She is not ill-appearing.  HENT:     Head: Normocephalic and atraumatic.     Nose: Nose normal.  Mouth/Throat:     Mouth: Mucous membranes are moist.  Eyes:     Extraocular Movements: Extraocular movements intact.     Conjunctiva/sclera: Conjunctivae normal.     Pupils: Pupils are equal, round, and reactive to light.  Cardiovascular:     Rate and Rhythm: Normal rate and regular rhythm.     Pulses: Normal pulses.     Heart sounds: Normal heart sounds. No murmur heard. Pulmonary:     Effort: Pulmonary effort is normal. No respiratory distress.     Breath sounds: Normal breath sounds.  Abdominal:     General: Abdomen is flat.     Palpations: Abdomen is soft.     Tenderness: There is abdominal tenderness.  Musculoskeletal:        General: No swelling.     Cervical back: Normal range of motion and neck supple.  Skin:    General: Skin is warm and dry.     Capillary Refill: Capillary refill takes less  than 2 seconds.  Neurological:     General: No focal deficit present.     Mental Status: She is alert and oriented to person, place, and time.     Cranial Nerves: No cranial nerve deficit.     Sensory: No sensory deficit.     Motor: No weakness.  Psychiatric:        Mood and Affect: Mood normal.     ED Results / Procedures / Treatments   Labs (all labs ordered are listed, but only abnormal results are displayed) Labs Reviewed  CBC WITH DIFFERENTIAL/PLATELET - Abnormal; Notable for the following components:      Result Value   WBC 12.1 (*)    Hemoglobin 11.5 (*)    HCT 34.5 (*)    Neutro Abs 9.0 (*)    Abs Immature Granulocytes 0.23 (*)    All other components within normal limits  COMPREHENSIVE METABOLIC PANEL - Abnormal; Notable for the following components:   Glucose, Bld 102 (*)    Calcium 8.4 (*)    Total Protein 5.7 (*)    Albumin 2.8 (*)    AST 14 (*)    All other components within normal limits  LIPASE, BLOOD - Abnormal; Notable for the following components:   Lipase <10 (*)    All other components within normal limits  URINALYSIS, ROUTINE W REFLEX MICROSCOPIC - Abnormal; Notable for the following components:   APPearance HAZY (*)    Ketones, ur TRACE (*)    Protein, ur TRACE (*)    Leukocytes,Ua LARGE (*)    Bacteria, UA FEW (*)    All other components within normal limits  C DIFFICILE QUICK SCREEN W PCR REFLEX    GASTROINTESTINAL PANEL BY PCR, STOOL (REPLACES STOOL CULTURE)  URINE CULTURE  MAGNESIUM  TROPONIN I (HIGH SENSITIVITY)    EKG EKG Interpretation Date/Time:  Sunday August 19 2023 17:34:12 EST Ventricular Rate:  60 PR Interval:    QRS Duration:  110 QT Interval:  476 QTC Calculation: 476 R Axis:   -10  Text Interpretation: Junctional rhythm Low voltage, precordial leads Abnormal R-wave progression, early transition Nonspecific T abnormalities, lateral leads Confirmed by Virgina Norfolk [phone]) on 08/19/2023 6:08:24 PM  Radiology CT  ABDOMEN PELVIS W CONTRAST  Result Date: 08/19/2023 CLINICAL DATA:  Acute abdominal pain as well as diarrhea and fatigue. EXAM: CT ABDOMEN AND PELVIS WITH CONTRAST TECHNIQUE: Multidetector CT imaging of the abdomen and pelvis was performed using the standard protocol following bolus administration of intravenous  contrast. RADIATION DOSE REDUCTION: This exam was performed according to the departmental dose-optimization program which includes automated exposure control, adjustment of the mA and/or kV according to patient size and/or use of iterative reconstruction technique. CONTRAST:  OMNIPAQUE IOHEXOL 300 MG/ML  SOLN COMPARISON:  Lumbar spine radiographs dated 10/13/2022. FINDINGS: Lower chest: No acute abnormality. Hepatobiliary: Two hypoattenuating lesions in the left hepatic lobe measure 0.8 cm (series 2, image 30) and 1.1 cm (series 2, image 26). Gallstones are seen in the gallbladder. No gallbladder wall thickening. The common bile duct measures 1.4 cm in diameter. Pancreas: Unremarkable. No pancreatic ductal dilatation or surrounding inflammatory changes. Spleen: Normal in size without focal abnormality. Adrenals/Urinary Tract: Adrenal glands are unremarkable. Bilateral renal cysts measure up to 3.0 cm on the left. No renal calculi or hydronephrosis. Bladder is unremarkable. Stomach/Bowel: The patient is status post gastric bypass. There is bowel wall thickening involving the rectum and colon with areas of associated fat stranding. The appendix appears surgically absent. The small bowel appears normal. No evidence of bowel obstruction. Vascular/Lymphatic: Aortic atherosclerosis. No enlarged abdominal or pelvic lymph nodes. Reproductive: Uterus is unremarkable. Other: No abdominal wall hernia or abnormality. No abdominopelvic ascites. Musculoskeletal: Degenerative changes are seen in the spine. IMPRESSION: 1. Bowel wall thickening involving the rectum and colon with areas of associated fat stranding is  consistent with proctocolitis. 2. Cholelithiasis without evidence of acute cholecystitis. 3. Dilatation of the common bile duct measuring up to 1.4 cm in diameter. If there is concern for biliary obstruction, MRCP or ERCP could be considered. 4. Two hypoattenuating lesions in the left hepatic lobe are incompletely characterized on this single phase study. If the patient has no known liver disease or known malignancy, these are likely benign, however can be further evaluated with MRI of the abdomen with and without contrast. Aortic Atherosclerosis (ICD10-I70.0). Electronically Signed   By: Romona Curls M.D.   On: 08/19/2023 20:54   DG Chest Portable 1 View  Result Date: 08/19/2023 CLINICAL DATA:  Shortness of breath EXAM: PORTABLE CHEST 1 VIEW COMPARISON:  10/13/2022 FINDINGS: Left-sided pacing device as before. Upper normal cardiac size. No acute airspace disease, pleural effusion or pneumothorax. Multiple clips in the GE junction region. IMPRESSION: No active disease. Electronically Signed   By: Jasmine Pang M.D.   On: 08/19/2023 17:52    Procedures Procedures    Medications Ordered in ED Medications  sodium chloride 0.9 % bolus 1,000 mL ( Intravenous Stopped 08/19/23 1958)  iohexol (OMNIPAQUE) 300 MG/ML solution 100 mL (100 mLs Intravenous Contrast Given 08/19/23 1931)    ED Course/ Medical Decision Making/ A&P                                 Medical Decision Making Amount and/or Complexity of Data Reviewed Labs: ordered. Radiology: ordered.  Risk Prescription drug management.   Harless Nakayama [facility] here with abdominal pain, diarrhea, there is some chest discomfort.  She been having diarrhea for several weeks.  She is starting to have some heartburn symptoms as well.  She has been taking Imodium.  Started had a negative [name] test.  This, started after taking Augmentin 2 months ago.  EKG shows sinus rhythm.  No ischemic changes.  Chest x-ray shows no evidence of pneumonia or  pneumothorax.  Overall I do not think any cardiac or pulmonary process is ongoing.  I think this could be some IBS or some other sort  of inflammatory process or could be some GI pathogen process.  Will recheck [name] will check troponin stool pathogen panel basic labs to look for dehydration or electrolyte abnormality.  Will get a CT scan to further evaluate as well.  Per my review and interpretation the labs no significant anemia or electrolyte abnormality kidney injury or leukocytosis.  Troponin negative x 2.  Chest x-ray negative for infection per my review and interpretation.  Overall CT scan shows some proctocolitis which I would expect given her diarrhea here in the last 6 or 7 weeks.  I have no concern that there is any biliary obstruction given her labs and her history and physical.  She is made aware of need for outpatient MRI to further evaluate for lesions on her liver.  Overall we did shared decision to hold on antibiotics.  She is already had a negative [name] recently.  I do not want to give antibiotics and make things worse just in case it is [name] and overall shared decision was made to have her follow-up stool studies with her primary care doctor and will refer her to gastroenterology.  This could be an inflammatory process or some sort of inflammatory bowel disease.  But at this time there is no emergent findings.  She understands return precautions and discharged in the ED in good condition.  This chart was dictated using voice recognition software.  Despite best efforts to proofread,  errors can occur which can change the documentation meaning.         Final Clinical Impression(s) / ED Diagnoses Final diagnoses:  Diarrhea, unspecified type    Rx / DC Orders ED Discharge Orders     None         Virgina Norfolk, DO 08/19/23 2130

## 2023-08-19 NOTE — ED Triage Notes (Signed)
In for eval of diarrhea, fatigue, bilateral calf cramping, and loss of appetite. Reports 20 lb weight loss.  In October started having diarrhea and sore throat. Diag with bronchitis and strep. Given Augmentin x5 days and prednisone.

## 2023-08-20 ENCOUNTER — Telehealth (HOSPITAL_BASED_OUTPATIENT_CLINIC_OR_DEPARTMENT_OTHER): Payer: Self-pay | Admitting: Emergency Medicine

## 2023-08-20 LAB — CLOSTRIDIUM DIFFICILE BY PCR, REFLEXED: Toxigenic C. Difficile by PCR: POSITIVE — AB

## 2023-08-20 MED ORDER — VANCOMYCIN HCL 125 MG PO CAPS: 125.0000 mg | ORAL_CAPSULE | Freq: Four times a day (QID) | ORAL | 0 refills | Status: AC

## 2023-08-20 NOTE — Telephone Encounter (Signed)
Called in Vancomycin for cdiff. Pt aware.

## 2023-08-21 LAB — GASTROINTESTINAL PANEL BY PCR, STOOL (REPLACES STOOL CULTURE)

## 2023-08-21 LAB — URINE CULTURE

## 2023-08-28 NOTE — Progress Notes (Signed)
Pt had diarrhea.

## 2023-09-10 ENCOUNTER — Other Ambulatory Visit: Payer: Self-pay

## 2023-09-10 ENCOUNTER — Emergency Department (HOSPITAL_BASED_OUTPATIENT_CLINIC_OR_DEPARTMENT_OTHER): Payer: Medicare PPO

## 2023-09-10 ENCOUNTER — Inpatient Hospital Stay (HOSPITAL_BASED_OUTPATIENT_CLINIC_OR_DEPARTMENT_OTHER)
Admission: EM | Admit: 2023-09-10 | Discharge: 2023-09-16 | DRG: 372 | Disposition: A | Discharge status: Home / Self Care | Payer: Medicare PPO | Attending: Internal Medicine | Admitting: Internal Medicine

## 2023-09-10 DIAGNOSIS — E782 Mixed hyperlipidemia: Secondary | ICD-10-CM | POA: Diagnosis present

## 2023-09-10 DIAGNOSIS — E559 Vitamin D deficiency, unspecified: Secondary | ICD-10-CM | POA: Diagnosis present

## 2023-09-10 DIAGNOSIS — K8020 Calculus of gallbladder without cholecystitis without obstruction: Secondary | ICD-10-CM | POA: Diagnosis present

## 2023-09-10 DIAGNOSIS — Z803 Family history of malignant neoplasm of breast: Secondary | ICD-10-CM | POA: Diagnosis not present

## 2023-09-10 DIAGNOSIS — K529 Noninfective gastroenteritis and colitis, unspecified: Principal | ICD-10-CM

## 2023-09-10 DIAGNOSIS — Z79899 Other long term (current) drug therapy: Secondary | ICD-10-CM

## 2023-09-10 DIAGNOSIS — I480 Paroxysmal atrial fibrillation: Secondary | ICD-10-CM | POA: Diagnosis present

## 2023-09-10 DIAGNOSIS — K838 Other specified diseases of biliary tract: Secondary | ICD-10-CM | POA: Diagnosis not present

## 2023-09-10 DIAGNOSIS — Z7901 Long term (current) use of anticoagulants: Secondary | ICD-10-CM

## 2023-09-10 DIAGNOSIS — A09 Infectious gastroenteritis and colitis, unspecified: Secondary | ICD-10-CM | POA: Diagnosis not present

## 2023-09-10 DIAGNOSIS — Z809 Family history of malignant neoplasm, unspecified: Secondary | ICD-10-CM

## 2023-09-10 DIAGNOSIS — K829 Disease of gallbladder, unspecified: Secondary | ICD-10-CM | POA: Diagnosis present

## 2023-09-10 DIAGNOSIS — Z9884 Bariatric surgery status: Secondary | ICD-10-CM

## 2023-09-10 DIAGNOSIS — D649 Anemia, unspecified: Secondary | ICD-10-CM | POA: Diagnosis present

## 2023-09-10 DIAGNOSIS — Z885 Allergy status to narcotic agent status: Secondary | ICD-10-CM | POA: Diagnosis not present

## 2023-09-10 DIAGNOSIS — I495 Sick sinus syndrome: Secondary | ICD-10-CM | POA: Diagnosis present

## 2023-09-10 DIAGNOSIS — M8580 Other specified disorders of bone density and structure, unspecified site: Secondary | ICD-10-CM | POA: Diagnosis present

## 2023-09-10 DIAGNOSIS — N281 Cyst of kidney, acquired: Secondary | ICD-10-CM | POA: Diagnosis not present

## 2023-09-10 DIAGNOSIS — F325 Major depressive disorder, single episode, in full remission: Secondary | ICD-10-CM | POA: Diagnosis present

## 2023-09-10 DIAGNOSIS — I10 Essential (primary) hypertension: Secondary | ICD-10-CM | POA: Diagnosis present

## 2023-09-10 DIAGNOSIS — Z823 Family history of stroke: Secondary | ICD-10-CM

## 2023-09-10 DIAGNOSIS — Z950 Presence of cardiac pacemaker: Secondary | ICD-10-CM

## 2023-09-10 DIAGNOSIS — K828 Other specified diseases of gallbladder: Secondary | ICD-10-CM | POA: Diagnosis not present

## 2023-09-10 DIAGNOSIS — A0472 Enterocolitis due to Clostridium difficile, not specified as recurrent: Secondary | ICD-10-CM | POA: Diagnosis not present

## 2023-09-10 DIAGNOSIS — E669 Obesity, unspecified: Secondary | ICD-10-CM | POA: Diagnosis present

## 2023-09-10 DIAGNOSIS — E876 Hypokalemia: Secondary | ICD-10-CM | POA: Diagnosis not present

## 2023-09-10 DIAGNOSIS — R748 Abnormal levels of other serum enzymes: Secondary | ICD-10-CM | POA: Diagnosis not present

## 2023-09-10 DIAGNOSIS — E872 Acidosis, unspecified: Secondary | ICD-10-CM | POA: Diagnosis present

## 2023-09-10 DIAGNOSIS — Z6836 Body mass index (BMI) 36.0-36.9, adult: Secondary | ICD-10-CM

## 2023-09-10 DIAGNOSIS — A0471 Enterocolitis due to Clostridium difficile, recurrent: Secondary | ICD-10-CM | POA: Diagnosis present

## 2023-09-10 DIAGNOSIS — R932 Abnormal findings on diagnostic imaging of liver and biliary tract: Secondary | ICD-10-CM | POA: Diagnosis not present

## 2023-09-10 DIAGNOSIS — Z836 Family history of other diseases of the respiratory system: Secondary | ICD-10-CM | POA: Diagnosis not present

## 2023-09-10 LAB — CBC WITH DIFFERENTIAL/PLATELET
Abs Immature Granulocytes: 0.04 10*3/uL (ref 0.00–0.07)
Basophils Absolute: 0.1 10*3/uL (ref 0.0–0.1)
Basophils Relative: 1 %
Eosinophils Absolute: 0.2 10*3/uL (ref 0.0–0.5)
Eosinophils Relative: 3 %
HCT: 32.2 % — ABNORMAL LOW (ref 36.0–46.0)
Hemoglobin: 10.5 g/dL — ABNORMAL LOW (ref 12.0–15.0)
Immature Granulocytes: 0 %
Lymphocytes Relative: 19 %
Lymphs Abs: 1.8 10*3/uL (ref 0.7–4.0)
MCH: 28.1 pg (ref 26.0–34.0)
MCHC: 32.6 g/dL (ref 30.0–36.0)
MCV: 86.1 fL (ref 80.0–100.0)
Monocytes Absolute: 1.5 10*3/uL — ABNORMAL HIGH (ref 0.1–1.0)
Monocytes Relative: 15 %
Neutro Abs: 6.1 10*3/uL (ref 1.7–7.7)
Neutrophils Relative %: 62 %
Platelets: 327 10*3/uL (ref 150–400)
RBC: 3.74 MIL/uL — ABNORMAL LOW (ref 3.87–5.11)
RDW: 14.8 % (ref 11.5–15.5)
WBC: 9.6 10*3/uL (ref 4.0–10.5)
nRBC: 0 % (ref 0.0–0.2)

## 2023-09-10 LAB — URINALYSIS, W/ REFLEX TO CULTURE (INFECTION SUSPECTED)
Bilirubin Urine: NEGATIVE
Glucose, UA: NEGATIVE mg/dL
Hgb urine dipstick: NEGATIVE
Ketones, ur: 15 mg/dL — AB
Nitrite: NEGATIVE
Protein, ur: 30 mg/dL — AB
Specific Gravity, Urine: 1.032 — ABNORMAL HIGH (ref 1.005–1.030)
pH: 5.5 (ref 5.0–8.0)

## 2023-09-10 LAB — COMPREHENSIVE METABOLIC PANEL
ALT: 11 U/L (ref 0–44)
AST: 19 U/L (ref 15–41)
Albumin: 3.3 g/dL — ABNORMAL LOW (ref 3.5–5.0)
Alkaline Phosphatase: 64 U/L (ref 38–126)
Anion gap: 9 (ref 5–15)
BUN: 19 mg/dL (ref 8–23)
CO2: 21 mmol/L — ABNORMAL LOW (ref 22–32)
Calcium: 8.7 mg/dL — ABNORMAL LOW (ref 8.9–10.3)
Chloride: 104 mmol/L (ref 98–111)
Creatinine, Ser: 0.88 mg/dL (ref 0.44–1.00)
GFR, Estimated: >60 mL/min (ref >60)
Glucose, Bld: 102 mg/dL — ABNORMAL HIGH (ref 70–99)
Potassium: 3.7 mmol/L (ref 3.5–5.1)
Sodium: 134 mmol/L — ABNORMAL LOW (ref 135–145)
Total Bilirubin: 0.7 mg/dL (ref <1.2)
Total Protein: 6.8 g/dL (ref 6.5–8.1)

## 2023-09-10 LAB — LACTIC ACID, PLASMA: Lactic Acid, Venous: 0.8 mmol/L (ref 0.5–1.9)

## 2023-09-10 MED ORDER — METRONIDAZOLE 500 MG/100ML IV SOLN
500.0000 mg | Freq: Once | INTRAVENOUS | Status: AC
  Administered 2023-09-11: 500 mg via INTRAVENOUS
  Filled 2023-09-10: qty 100

## 2023-09-10 MED ORDER — SODIUM CHLORIDE 0.9 % IV BOLUS
1000.0000 mL | Freq: Once | INTRAVENOUS | Status: AC
  Administered 2023-09-10: 1000 mL via INTRAVENOUS

## 2023-09-10 MED ORDER — IOHEXOL 300 MG/ML  SOLN
100.0000 mL | Freq: Once | INTRAMUSCULAR | Status: AC | PRN
  Administered 2023-09-10: 100 mL via INTRAVENOUS

## 2023-09-10 NOTE — ED Notes (Signed)
Pt transported to CT via wheelchair.

## 2023-09-10 NOTE — Plan of Care (Addendum)
Plan of Care Note for accepted transfer  Patient: Desiree James              ELF:[id number]  DOA: 09/10/2023     Facility requesting transfer: [facility] emergency department Requesting Provider: Dr. Rubin Payor  Reason for transfer: [name] colitis, cholelithiasis with with biliary ductal dilation> need MRCP and GI evaluation.  Facility course: 72 year old female history of , essential hypertension, paroxysmal atrial fibrillation, and sick sinus syndrome s/p pacemaker placement presented to emergency department evaluation for diarrhea.    Recent history of [name]. Has been on oral vancomycin. Finished that 3 days ago. Now having more diarrhea including fevers and chills. Reported temperature up to 101.4 at home. No real abdominal pain. Some mild decreased oral intake. Had been on Augmentin prior to the [name] diagnosis.   At presentation to ED patient is hemodynamically stable. CMP showing low sodium 134, low bicarb 21 otherwise unremarkable. CBC unremarkable except low H&H 10.5 and 32.  Baseline hemoglobin around 12.8-13.6. Lactic acid 0.8 within normal range. UA hazy appearance, elevated specific gravity, ketone positive, protein 30, leukocyte esterase small and many bacteria. Blood cultures are in process.  CT abdomen pelvis showed evidence of pancolitis related to [name] colitis.  No perforation or abscess. Distended gallbladder with stones. Mild intra and extrahepatic biliary dilatation, common bile duct measuring up to 11 mm. Correlate with LFTs with follow-up MRCP as indicated.  In the ED patient has been started treating with IV metronidazole and 1 L of NS bolus.  In the setting of recurrent [name] failed with oral vancomycin patient is to be admitted alternately treatment.    Also requested Dr. Nicanor Alcon to reach out to on-call GI regarding the gallstone with distended biliary duct.  Patient will also need MRCP.  Dr. Nicanor Alcon will reach out to on-call  GI for further recommendation and possible evaluation in the a.m.  Update at 6:23 AM, GI has been consulted, Dr. Ewing Schlein has been added by Dr. Dulce Sellar for evaluation for the patient in the daytime.  Plan of care: The patient is accepted for admission for inpatient status to Telemetry unit, at [facility].  Check [email or web address] for on-call coverage.  [facility] will assume care on arrival to accepting facility. Until arrival, medical decision making responsibilities remain with the EDP.  However, [facility] available 24/7 for questions and assistance.   Nursing staff please page [facility] Admits and Consults [phone]) as soon as the patient arrives to the hospital.    Author: Tereasa Coop, MD  09/10/2023  [facility]

## 2023-09-10 NOTE — ED Provider Notes (Signed)
[place] EMERGENCY DEPARTMENT AT [facility] Provider Note   CSN: [id number] Arrival date & time: 09/10/23  1817     History  Chief Complaint  Patient presents with   Diarrhea    Desiree James is a 72 y.o. female.   Diarrhea Patient with diarrhea.  Recent history of [name].  Has been on oral vancomycin.  Finished that 3 days ago.  Now having more diarrhea including fevers and chills.  Reported temperature up to 101.4 at home.  No real abdominal pain.  Some mild decreased oral intake.  Had been on Augmentin prior to the [name] diagnosis.    Past Medical History:  Diagnosis Date   Hypertension    Pacemaker Dual chamber Pacemaker Assurity 04/03/2022   05/09/2022   Sinus node dysfunction (HCC) 06/11/2022    Home Medications Prior to Admission medications   Medication Sig Start Date End Date Taking? Authorizing Provider  busPIRone (BUSPAR) 5 MG tablet Take 5 mg by mouth 2 (two) times daily. 04/23/23   [provider]  Calcium-Magnesium-Vitamin D (SUPER CAL-MAG-D PO) Take 1 tablet by mouth daily.    [provider]  docusate sodium (COLACE) 100 MG capsule Take 100 mg by mouth daily.    [provider]  ezetimibe (ZETIA) 10 MG tablet Take 1 tablet (10 mg total) by mouth daily. 05/24/23   Patwardhan, Anabel Bene, MD  ferrous sulfate 325 (65 FE) MG EC tablet Take 325 mg by mouth daily.    [provider]  labetalol (NORMODYNE) 200 MG tablet Take 1 tablet (200 mg total) by mouth 2 (two) times daily. 05/24/23   Patwardhan, Anabel Bene, MD  losartan (COZAAR) 100 MG tablet Take 1 tablet (100 mg total) by mouth daily. 05/24/23   Patwardhan, Anabel Bene, MD  methocarbamol (ROBAXIN) 500 MG tablet Take 1 tablet (500 mg total) by mouth every 8 (eight) hours as needed for muscle spasms. 10/13/22   Arby Barrette, MD  Multiple Vitamin (MULTI VITAMIN) TABS Take 1 tablet by mouth daily.    [provider]  Omega-3 Fatty Acids (FISH OIL)  1200 MG CAPS Take 1,200 mg by mouth daily.    [provider]  rivaroxaban (XARELTO) 20 MG TABS tablet Take 1 tablet (20 mg total) by mouth daily with supper. 05/24/23   Patwardhan, Anabel Bene, MD  rosuvastatin (CRESTOR) 10 MG tablet Take 10 mg by mouth 2 (two) times a week. 11/25/21   [provider]  SUPER B COMPLEX/C PO Take 1 tablet by mouth daily.    [provider]  traMADol (ULTRAM) 50 MG tablet Take 1 tablet (50 mg total) by mouth every 6 (six) hours as needed. 10/13/22   Arby Barrette, MD      Allergies    Morphine    Review of Systems   Review of Systems  Gastrointestinal:  Positive for diarrhea.    Physical Exam Updated Vital Signs BP (!) 123/53   Pulse 88   Temp 98.7 F (37.1 C) (Oral)   Resp 16   SpO2 94%  Physical Exam Vitals and nursing note reviewed.  Cardiovascular:     Rate and Rhythm: Normal rate.  Pulmonary:     Breath sounds: No wheezing.  Abdominal:     Tenderness: There is no abdominal tenderness.  Musculoskeletal:        General: No tenderness.  Neurological:     Mental Status: She is alert.     ED Results / Procedures / Treatments  Labs (all labs ordered are listed, but only abnormal results are displayed) Labs Reviewed  COMPREHENSIVE METABOLIC PANEL - Abnormal; Notable for the following components:      Result Value   Sodium 134 (*)    CO2 21 (*)    Glucose, Bld 102 (*)    Calcium 8.7 (*)    Albumin 3.3 (*)    All other components within normal limits  CBC WITH DIFFERENTIAL/PLATELET - Abnormal; Notable for the following components:   RBC 3.74 (*)    Hemoglobin 10.5 (*)    HCT 32.2 (*)    Monocytes Absolute 1.5 (*)    All other components within normal limits  URINALYSIS, W/ REFLEX TO CULTURE (INFECTION SUSPECTED) - Abnormal; Notable for the following components:   APPearance HAZY (*)    Specific Gravity, Urine 1.032 (*)    Ketones, ur 15 (*)    Protein, ur 30 (*)    Leukocytes,Ua SMALL (*)    Bacteria, UA  RARE (*)    All other components within normal limits  LACTIC ACID, PLASMA  LACTIC ACID, PLASMA    EKG None  Radiology No results found.  Procedures Procedures    Medications Ordered in ED Medications  sodium chloride 0.9 % bolus 1,000 mL (1,000 mLs Intravenous New Bag/Given 09/10/23 2141)  iohexol (OMNIPAQUE) 300 MG/ML solution 100 mL (100 mLs Intravenous Contrast Given 09/10/23 2205)    ED Course/ Medical Decision Making/ A&P                                 Medical Decision Making Amount and/or Complexity of Data Reviewed Labs: ordered. Radiology: ordered.  Risk Prescription drug management.   Patient with diarrhea.  Fevers.  [name] and has been on vancomycin.  Now is worsened.  Decreased oral intake.  I think [name] is high on the differential.  Has had colitis also on recent CT scan.  I think he would benefit from rehab CT and at this time.  White count reassuring however.  Reviewed previous CT scan and previous ER note.  CT scan read pending.  Does show some colonic thickening by my read however.  Care turned over to Dr Nicanor Alcon        Final Clinical Impression(s) / ED Diagnoses Final diagnoses:  None    Rx / DC Orders ED Discharge Orders     None         Benjiman Core, MD 09/10/23 2334

## 2023-09-10 NOTE — ED Triage Notes (Addendum)
Diarrhea since Oct- confirmed C.Diff in Nov- has been taking multiple antibiotics. All have been completed-last dose 3 days ago. Symptoms have returned. Loose stool 3-4 times a day. 101.4 fever at home.

## 2023-09-11 ENCOUNTER — Encounter (HOSPITAL_COMMUNITY): Payer: Self-pay | Admitting: Internal Medicine

## 2023-09-11 DIAGNOSIS — K529 Noninfective gastroenteritis and colitis, unspecified: Secondary | ICD-10-CM | POA: Diagnosis present

## 2023-09-11 DIAGNOSIS — D649 Anemia, unspecified: Secondary | ICD-10-CM | POA: Diagnosis present

## 2023-09-11 DIAGNOSIS — B349 Viral infection, unspecified: Secondary | ICD-10-CM | POA: Insufficient documentation

## 2023-09-11 DIAGNOSIS — Z6836 Body mass index (BMI) 36.0-36.9, adult: Secondary | ICD-10-CM | POA: Diagnosis not present

## 2023-09-11 DIAGNOSIS — Z809 Family history of malignant neoplasm, unspecified: Secondary | ICD-10-CM | POA: Diagnosis not present

## 2023-09-11 DIAGNOSIS — A09 Infectious gastroenteritis and colitis, unspecified: Secondary | ICD-10-CM | POA: Diagnosis not present

## 2023-09-11 DIAGNOSIS — Z9884 Bariatric surgery status: Secondary | ICD-10-CM | POA: Diagnosis not present

## 2023-09-11 DIAGNOSIS — Z885 Allergy status to narcotic agent status: Secondary | ICD-10-CM | POA: Diagnosis not present

## 2023-09-11 DIAGNOSIS — M8580 Other specified disorders of bone density and structure, unspecified site: Secondary | ICD-10-CM | POA: Diagnosis present

## 2023-09-11 DIAGNOSIS — R932 Abnormal findings on diagnostic imaging of liver and biliary tract: Secondary | ICD-10-CM | POA: Diagnosis not present

## 2023-09-11 DIAGNOSIS — E559 Vitamin D deficiency, unspecified: Secondary | ICD-10-CM | POA: Diagnosis present

## 2023-09-11 DIAGNOSIS — E782 Mixed hyperlipidemia: Secondary | ICD-10-CM | POA: Diagnosis present

## 2023-09-11 DIAGNOSIS — F325 Major depressive disorder, single episode, in full remission: Secondary | ICD-10-CM | POA: Diagnosis present

## 2023-09-11 DIAGNOSIS — E7800 Pure hypercholesterolemia, unspecified: Secondary | ICD-10-CM | POA: Insufficient documentation

## 2023-09-11 DIAGNOSIS — E669 Obesity, unspecified: Secondary | ICD-10-CM | POA: Diagnosis present

## 2023-09-11 DIAGNOSIS — I480 Paroxysmal atrial fibrillation: Secondary | ICD-10-CM | POA: Diagnosis present

## 2023-09-11 DIAGNOSIS — Z836 Family history of other diseases of the respiratory system: Secondary | ICD-10-CM | POA: Diagnosis not present

## 2023-09-11 DIAGNOSIS — E876 Hypokalemia: Secondary | ICD-10-CM | POA: Diagnosis not present

## 2023-09-11 DIAGNOSIS — Z803 Family history of malignant neoplasm of breast: Secondary | ICD-10-CM | POA: Diagnosis not present

## 2023-09-11 DIAGNOSIS — K829 Disease of gallbladder, unspecified: Secondary | ICD-10-CM | POA: Diagnosis present

## 2023-09-11 DIAGNOSIS — A0472 Enterocolitis due to Clostridium difficile, not specified as recurrent: Secondary | ICD-10-CM | POA: Diagnosis not present

## 2023-09-11 DIAGNOSIS — I495 Sick sinus syndrome: Secondary | ICD-10-CM | POA: Diagnosis present

## 2023-09-11 DIAGNOSIS — R748 Abnormal levels of other serum enzymes: Secondary | ICD-10-CM | POA: Diagnosis not present

## 2023-09-11 DIAGNOSIS — Z950 Presence of cardiac pacemaker: Secondary | ICD-10-CM | POA: Diagnosis not present

## 2023-09-11 DIAGNOSIS — K8020 Calculus of gallbladder without cholecystitis without obstruction: Secondary | ICD-10-CM | POA: Diagnosis present

## 2023-09-11 DIAGNOSIS — A0471 Enterocolitis due to Clostridium difficile, recurrent: Secondary | ICD-10-CM | POA: Diagnosis present

## 2023-09-11 DIAGNOSIS — I10 Essential (primary) hypertension: Secondary | ICD-10-CM | POA: Diagnosis present

## 2023-09-11 DIAGNOSIS — Z79899 Other long term (current) drug therapy: Secondary | ICD-10-CM | POA: Diagnosis not present

## 2023-09-11 DIAGNOSIS — E872 Acidosis, unspecified: Secondary | ICD-10-CM | POA: Diagnosis present

## 2023-09-11 DIAGNOSIS — Z7901 Long term (current) use of anticoagulants: Secondary | ICD-10-CM | POA: Diagnosis not present

## 2023-09-11 DIAGNOSIS — Z823 Family history of stroke: Secondary | ICD-10-CM | POA: Diagnosis not present

## 2023-09-11 LAB — CBC WITH DIFFERENTIAL/PLATELET
Abs Immature Granulocytes: 0.04 10*3/uL (ref 0.00–0.07)
Basophils Absolute: 0.1 10*3/uL (ref 0.0–0.1)
Basophils Relative: 1 %
Eosinophils Absolute: 0.3 10*3/uL (ref 0.0–0.5)
Eosinophils Relative: 4 %
HCT: 30.6 % — ABNORMAL LOW (ref 36.0–46.0)
Hemoglobin: 10 g/dL — ABNORMAL LOW (ref 12.0–15.0)
Immature Granulocytes: 0 %
Lymphocytes Relative: 24 %
Lymphs Abs: 2.3 10*3/uL (ref 0.7–4.0)
MCH: 28.4 pg (ref 26.0–34.0)
MCHC: 32.7 g/dL (ref 30.0–36.0)
MCV: 86.9 fL (ref 80.0–100.0)
Monocytes Absolute: 0.9 10*3/uL (ref 0.1–1.0)
Monocytes Relative: 10 %
Neutro Abs: 5.8 10*3/uL (ref 1.7–7.7)
Neutrophils Relative %: 61 %
Platelets: 281 10*3/uL (ref 150–400)
RBC: 3.52 MIL/uL — ABNORMAL LOW (ref 3.87–5.11)
RDW: 14.6 % (ref 11.5–15.5)
WBC: 9.4 10*3/uL (ref 4.0–10.5)
nRBC: 0 % (ref 0.0–0.2)

## 2023-09-11 LAB — COMPREHENSIVE METABOLIC PANEL
ALT: 13 U/L (ref 0–44)
AST: 14 U/L — ABNORMAL LOW (ref 15–41)
Albumin: 2.7 g/dL — ABNORMAL LOW (ref 3.5–5.0)
Alkaline Phosphatase: 68 U/L (ref 38–126)
Anion gap: 8 (ref 5–15)
BUN: 17 mg/dL (ref 8–23)
CO2: 21 mmol/L — ABNORMAL LOW (ref 22–32)
Calcium: 8.5 mg/dL — ABNORMAL LOW (ref 8.9–10.3)
Chloride: 107 mmol/L (ref 98–111)
Creatinine, Ser: 0.71 mg/dL (ref 0.44–1.00)
GFR, Estimated: >60 mL/min (ref >60)
Glucose, Bld: 87 mg/dL (ref 70–99)
Potassium: 2.9 mmol/L — ABNORMAL LOW (ref 3.5–5.1)
Sodium: 136 mmol/L (ref 135–145)
Total Bilirubin: 0.6 mg/dL (ref <1.2)
Total Protein: 6.2 g/dL — ABNORMAL LOW (ref 6.5–8.1)

## 2023-09-11 LAB — LACTIC ACID, PLASMA: Lactic Acid, Venous: 0.8 mmol/L (ref 0.5–1.9)

## 2023-09-11 LAB — MAGNESIUM: Magnesium: 1.8 mg/dL (ref 1.7–2.4)

## 2023-09-11 LAB — C DIFFICILE QUICK SCREEN W PCR REFLEX
C Diff antigen: POSITIVE — AB
C Diff interpretation: DETECTED
C Diff toxin: POSITIVE — AB

## 2023-09-11 MED ORDER — MAGNESIUM SULFATE 2 GM/50ML IV SOLN
2.0000 g | Freq: Once | INTRAVENOUS | Status: AC
  Administered 2023-09-11: 2 g via INTRAVENOUS
  Filled 2023-09-11: qty 50

## 2023-09-11 MED ORDER — SERTRALINE HCL 50 MG PO TABS
50.0000 mg | ORAL_TABLET | Freq: Every day | ORAL | Status: DC
  Administered 2023-09-11 – 2023-09-16 (×6): 50 mg via ORAL
  Filled 2023-09-11 (×6): qty 1

## 2023-09-11 MED ORDER — ACETAMINOPHEN 650 MG RE SUPP: 650.0000 mg | Freq: Four times a day (QID) | RECTAL | Status: DC | PRN

## 2023-09-11 MED ORDER — LORAZEPAM 2 MG/ML IJ SOLN: 0.5000 mg | Freq: Once | INTRAMUSCULAR | Status: DC | PRN

## 2023-09-11 MED ORDER — EZETIMIBE 10 MG PO TABS
10.0000 mg | ORAL_TABLET | Freq: Every day | ORAL | Status: DC
  Administered 2023-09-11 – 2023-09-16 (×6): 10 mg via ORAL
  Filled 2023-09-11 (×6): qty 1

## 2023-09-11 MED ORDER — ACETAMINOPHEN 325 MG PO TABS: 650.0000 mg | ORAL_TABLET | Freq: Four times a day (QID) | ORAL | Status: DC | PRN

## 2023-09-11 MED ORDER — LABETALOL HCL 100 MG PO TABS
200.0000 mg | ORAL_TABLET | Freq: Two times a day (BID) | ORAL | Status: DC
  Administered 2023-09-11 – 2023-09-16 (×10): 200 mg via ORAL
  Filled 2023-09-11 (×10): qty 2

## 2023-09-11 MED ORDER — VANCOMYCIN HCL 125 MG PO CAPS: 125.0000 mg | ORAL_CAPSULE | Freq: Every day | ORAL | Status: DC

## 2023-09-11 MED ORDER — ONDANSETRON HCL 4 MG PO TABS: 4.0000 mg | ORAL_TABLET | Freq: Four times a day (QID) | ORAL | Status: DC | PRN

## 2023-09-11 MED ORDER — POTASSIUM CHLORIDE IN NACL 40-0.9 MEQ/L-% IV SOLN
INTRAVENOUS | Status: AC
  Filled 2023-09-11 (×2): qty 1000

## 2023-09-11 MED ORDER — POTASSIUM CHLORIDE CRYS ER 20 MEQ PO TBCR
40.0000 meq | EXTENDED_RELEASE_TABLET | Freq: Once | ORAL | Status: AC
  Administered 2023-09-11: 40 meq via ORAL
  Filled 2023-09-11: qty 2

## 2023-09-11 MED ORDER — VANCOMYCIN HCL 125 MG PO CAPS: 125.0000 mg | ORAL_CAPSULE | ORAL | Status: DC

## 2023-09-11 MED ORDER — ONDANSETRON HCL 4 MG/2ML IJ SOLN: 4.0000 mg | Freq: Four times a day (QID) | INTRAMUSCULAR | Status: DC | PRN

## 2023-09-11 MED ORDER — BUSPIRONE HCL 5 MG PO TABS
5.0000 mg | ORAL_TABLET | Freq: Two times a day (BID) | ORAL | Status: DC
  Administered 2023-09-11 – 2023-09-16 (×11): 5 mg via ORAL
  Filled 2023-09-11 (×11): qty 1

## 2023-09-11 MED ORDER — ROSUVASTATIN CALCIUM 10 MG PO TABS
10.0000 mg | ORAL_TABLET | ORAL | Status: DC
  Administered 2023-09-13: 10 mg via ORAL
  Filled 2023-09-11 (×2): qty 1

## 2023-09-11 MED ORDER — FERROUS SULFATE 325 (65 FE) MG PO TABS
325.0000 mg | ORAL_TABLET | Freq: Every day | ORAL | Status: DC
Start: 2023-09-11 — End: 2023-09-16
  Administered 2023-09-11 – 2023-09-16 (×6): 325 mg via ORAL
  Filled 2023-09-11 (×6): qty 1

## 2023-09-11 MED ORDER — RIVAROXABAN 10 MG PO TABS
20.0000 mg | ORAL_TABLET | Freq: Every day | ORAL | Status: DC
  Administered 2023-09-11 – 2023-09-16 (×6): 20 mg via ORAL
  Filled 2023-09-11 (×6): qty 2

## 2023-09-11 MED ORDER — LOSARTAN POTASSIUM 50 MG PO TABS
100.0000 mg | ORAL_TABLET | Freq: Every day | ORAL | Status: DC
  Administered 2023-09-11 – 2023-09-16 (×6): 100 mg via ORAL
  Filled 2023-09-11 (×4): qty 2
  Filled 2023-09-11: qty 4
  Filled 2023-09-11: qty 2

## 2023-09-11 MED ORDER — VANCOMYCIN HCL 125 MG PO CAPS
125.0000 mg | ORAL_CAPSULE | Freq: Four times a day (QID) | ORAL | Status: DC
  Administered 2023-09-11 – 2023-09-12 (×4): 125 mg via ORAL
  Filled 2023-09-11 (×7): qty 1

## 2023-09-11 MED ORDER — VANCOMYCIN HCL 125 MG PO CAPS: 125.0000 mg | ORAL_CAPSULE | Freq: Two times a day (BID) | ORAL | Status: DC

## 2023-09-11 NOTE — ED Notes (Signed)
Pt ambulates to the bathroom with steady gait.

## 2023-09-11 NOTE — H&P (Signed)
History and Physical    Patient: Desiree James VWU:[id number] DOB: Dec 29, 1950 DOA: 09/10/2023 DOS: the patient was seen and examined on 09/11/2023 PCP: Adrian Prince, MD  Patient coming from: Home  Chief Complaint:  Chief Complaint  Patient presents with   Diarrhea   HPI: Desiree James is a 72 y.o. female with medical history significant of retrognathia, hypertension, hyperlipidemia, depression in remission, osteopenia, vitamin D deficiency, paroxysmal atrial fibrillation on rivaroxaban,  sick sinus syndrome, pacemaker placements recently diagnosed with [name] colitis who finished vancomycin 3 days ago, but is returning to the emergency department with multiple episodes of diarrhea and fever.    Positive mild abdominal pain and nausea, but no emesis, constipation, melena or hematochezia.No sore throat, wheezing or hemoptysis.  No chest pain, palpitations, diaphoresis, PND, orthopnea or pitting edema of the lower extremities.  No flank pain, dysuria, frequency or hematuria.  No polyuria, polydipsia, polyphagia or blurred vision.   Lab work: Her urine analysis was hazy with specific 1.0 32, ketones of 15 and protein of 30 mg/dL.  Small leukocyte esterase and rare bacteria on microscopic examination.  Lactic acid was normal.  CBC showed a white count 9.6, hemoglobin 10.5 g/dL and platelets 295.  CMP showed a sodium of 134 and CO2 of 21 mmol/L.  Glucose 102 mg/deciliter and albumin 3.3 g/dL.  The rest of the CMP measurements were normal after calcium correction.  [name] antigen and [name] toxin was positive.  Imaging: CT abdomen/pelvis with contrast showing pancolitis, presumably related to her history of [name].  No perforation or abscess.  Distended gallbladder with stone.  Mild intra and extrahepatic dilatation, CBD measuring up to 11 mm.  Correlate with LFTs and follow-up with MRCP as indicated.  Aortic atherosclerosis.  ED course: Initial vital signs were  temperature 98.8 F, pulse 72, respiration 18, BP 118/78 mmHg O2 sat 100% on room air.  Patient received metronidazole 500 mg IVPB and 1000 mL normal saline bolus.   Review of Systems: As mentioned in the history of present illness. All other systems reviewed and are negative.  Past Medical History:  Diagnosis Date   Hypertension    Pacemaker Dual chamber Pacemaker Assurity 04/03/2022   05/09/2022   Sinus node dysfunction (HCC) 06/11/2022   Past Surgical History:  Procedure Laterality Date   APPENDECTOMY     GASTRIC BYPASS     HEMORROIDECTOMY     OOPHORECTOMY     OVARIAN CYST SURGERY     PACEMAKER IMPLANT N/A 04/03/2022   Procedure: PACEMAKER IMPLANT;  Surgeon: Marinus Maw, MD;  Location: [facility];  Service: Cardiovascular;  Laterality: N/A;   Social History:  reports that she has never smoked. She has never used smokeless tobacco. She reports current alcohol use. She reports that she does not use drugs.  Allergies  Allergen Reactions   Morphine Rash    Family History  Problem Relation Age of Onset   Stroke Mother    Stroke Father    Pneumonia Father    Breast cancer Sister    Pneumonia Brother    Cancer Brother     Prior to Admission medications   Medication Sig Start Date End Date Taking? Authorizing Provider  busPIRone (BUSPAR) 5 MG tablet Take 5 mg by mouth 2 (two) times daily. 04/23/23  Yes [provider]  Calcium-Magnesium-Vitamin D (SUPER CAL-MAG-D PO) Take 1 tablet by mouth daily.   Yes [provider]  docusate sodium (COLACE) 100 MG capsule  Take 100 mg by mouth daily.   Yes [provider]  ezetimibe (ZETIA) 10 MG tablet Take 1 tablet (10 mg total) by mouth daily. 05/24/23  Yes Patwardhan, Manish J, MD  ferrous sulfate 325 (65 FE) MG EC tablet Take 325 mg by mouth daily.   Yes [provider]  labetalol (NORMODYNE) 200 MG tablet Take 1 tablet (200 mg total) by mouth 2 (two) times daily. 05/24/23  Yes Patwardhan, Manish  J, MD  losartan (COZAAR) 100 MG tablet Take 1 tablet (100 mg total) by mouth daily. 05/24/23  Yes Patwardhan, Manish J, MD  methocarbamol (ROBAXIN) 500 MG tablet Take 1 tablet (500 mg total) by mouth every 8 (eight) hours as needed for muscle spasms. 10/13/22  Yes Arby Barrette, MD  Multiple Vitamin (MULTI VITAMIN) TABS Take 1 tablet by mouth daily.   Yes [provider]  Omega-3 Fatty Acids (FISH OIL) 1200 MG CAPS Take 1,200 mg by mouth daily.   Yes [provider]  rivaroxaban (XARELTO) 20 MG TABS tablet Take 1 tablet (20 mg total) by mouth daily with supper. 05/24/23  Yes Patwardhan, Manish J, MD  rosuvastatin (CRESTOR) 10 MG tablet Take 10 mg by mouth 2 (two) times a week. 11/25/21  Yes [provider]  sertraline (ZOLOFT) 50 MG tablet Take 50 mg by mouth daily.   Yes [provider]  SUPER B COMPLEX/C PO Take 1 tablet by mouth daily.   Yes [provider]  traMADol (ULTRAM) 50 MG tablet Take 1 tablet (50 mg total) by mouth every 6 (six) hours as needed. 10/13/22  Yes Arby Barrette, MD  vancomycin (VANCOCIN) 125 MG capsule Take 125 mg by mouth 4 (four) times daily.   Yes [provider]  Vitamins-Lipotropics (COMPLEX B-100-INOSITOL) TBCR Take 1 tablet by mouth daily.   Yes [provider]    Physical Exam: Vitals:   09/11/23 0345 09/11/23 0619 09/11/23 0926 09/11/23 1314  BP: (!) 137/56 128/62  (!) 144/60  Pulse: 65 60  67  Resp: 16 16  15   Temp: 98.1 F (36.7 C)  98.5 F (36.9 C) 98 F (36.7 C)  TempSrc: Oral  Oral Oral  SpO2: 98% 97%  97%   Physical Exam Vitals reviewed.  Constitutional:      General: She is awake. She is not in acute distress.    Appearance: She is obese. She is ill-appearing.  HENT:     Head: Normocephalic.     Nose: No rhinorrhea.     Mouth/Throat:     Mouth: Mucous membranes are moist.  Eyes:     General: No scleral icterus.    Pupils: Pupils are equal, round, and reactive to light.  Neck:      Vascular: No JVD.  Cardiovascular:     Rate and Rhythm: Normal rate and regular rhythm.     Heart sounds: S1 normal and S2 normal.  Pulmonary:     Effort: Pulmonary effort is normal.     Breath sounds: Normal breath sounds. No wheezing, rhonchi or rales.  Abdominal:     General: Bowel sounds are increased.     Palpations: Abdomen is soft.     Tenderness: There is no abdominal tenderness. There is no right CVA tenderness, left CVA tenderness or guarding.  Musculoskeletal:     Cervical back: Neck supple.     Right lower leg: No edema.     Left lower leg: No edema.  Skin:    General: Skin is  warm and dry.  Neurological:     General: No focal deficit present.     Mental Status: She is alert and oriented to person, place, and time.  Psychiatric:        Mood and Affect: Mood normal.        Behavior: Behavior normal. Behavior is cooperative.     Data Reviewed:  Results are pending, will review when available.  10/28/2020 echocardiogram report IMPRESSIONS:   1. Left ventricular ejection fraction, by estimation, is 55 to 60%. Left  ventricular ejection fraction by 3D volume is 56 %. The left ventricle has  normal function. The left ventricle has no regional wall motion  abnormalities. Left ventricular diastolic   parameters are consistent with Grade II diastolic dysfunction  (pseudonormalization).   2. Right ventricular systolic function is normal. The right ventricular  size is normal. There is normal pulmonary artery systolic pressure.   3. Left atrial size was severely dilated.   4. Right atrial size was mild to moderately dilated.   5. The mitral valve is normal in structure. Mild mitral valve  regurgitation. No evidence of mitral stenosis.   6. The aortic valve is tricuspid. Aortic valve regurgitation is not  visualized. No aortic stenosis is present.   7. Mildly dilated pulmonary artery.   8. The inferior vena cava is normal in size with greater than 50%   respiratory variability, suggesting right atrial pressure of 3 mmHg.   Assessment and Plan: Principal Problem:   Clostridium difficile colitis Telemetry/inpatient. Soft diet. Antiemetics as needed. Avoid PPIs. Vancomycin per protocol. Discussed with ID on-call. GI consult appreciated. -MRCP ordered.  Active Problems:   Essential hypertension Continue labetalol 200 mg p.o. twice daily. Continue losartan 100 mg p.o. daily.    PAF (paroxysmal atrial fibrillation) (HCC) CHA?DS?-VASc Score of at least 5. Continue rivaroxaban 20 mg p.o. daily. Continue labetalol as above for rate control. Optimize electrolytes.    Major depression in full remission (HCC) Continue buspirone 5 mg p.o. twice daily. Continue sertraline 50 mg p.o. daily.    Mixed hyperlipidemia Continue ezetimibe 10 mg p.o. daily. Continue rosuvastatin 10 mg p.o. twice a week.    Normocytic anemia Continue iron supplementation. Follow-up hematocrit and hemoglobin.    Hypokalemia Replenishing. Follow-up potassium level in AM. Magnesium sulfate 2 g IVPB.    Advance Care Planning:   Code Status: Full Code   Consults: Vida Rigger, MD [facility]).  Family Communication:   Severity of Illness: The appropriate patient status for this patient is INPATIENT. Inpatient status is judged to be reasonable and necessary in order to provide the required intensity of service to ensure the patient's safety. The patient's presenting symptoms, physical exam findings, and initial radiographic and laboratory data in the context of their chronic comorbidities is felt to place them at high risk for further clinical deterioration. Furthermore, it is not anticipated that the patient will be medically stable for discharge from the hospital within 2 midnights of admission.   * I certify that at the point of admission it is my clinical judgment that the patient will require inpatient hospital care spanning beyond 2 midnights from the  point of admission due to high intensity of service, high risk for further deterioration and high frequency of surveillance required.*  Author: Bobette Mo, MD 09/11/2023 1:43 PM  For on call review [email or web address].   This document was prepared using Dragon voice recognition software and may contain some unintended transcription errors.

## 2023-09-11 NOTE — Consult Note (Signed)
Reason for Consult: Dilated CBD and [name] Referring Physician: Hospital team  Desiree James is an 72 y.o. female.  HPI: Patient seen and examined in both her hospital computer chart and our office computer chart reviewed and she did have a normal colonoscopy 2 years ago by Dr. Matthias Hughs and she has had no previous history of colitis and her family history is negative and she did get better on vancomycin which she took for 10 days but her symptoms returned 3 to 5 days after stopping them and she really has not had any abdominal pain and she was on Augmentin prior to getting [name] but thought she was put on that because of [name] biopsy and she has not had any other GI issues that she can remember and does not remember any other antibiotic use  Past Medical History:  Diagnosis Date   Hypertension    Pacemaker Dual chamber Pacemaker Assurity 04/03/2022   05/09/2022   Sinus node dysfunction (HCC) 06/11/2022    Past Surgical History:  Procedure Laterality Date   APPENDECTOMY     GASTRIC BYPASS     HEMORROIDECTOMY     OOPHORECTOMY     OVARIAN CYST SURGERY     PACEMAKER IMPLANT N/A 04/03/2022   Procedure: PACEMAKER IMPLANT;  Surgeon: Marinus Maw, MD;  Location: [facility];  Service: Cardiovascular;  Laterality: N/A;    Family History  Problem Relation Age of Onset   Stroke Mother    Stroke Father    Pneumonia Father    Breast cancer Sister    Pneumonia Brother    Cancer Brother     Social History:  reports that she has never smoked. She has never used smokeless tobacco. She reports current alcohol use. She reports that she does not use drugs.  Allergies:  Allergies  Allergen Reactions   Morphine Rash    Medications: I have reviewed the patient's current medications.  Results for orders placed or performed during the hospital encounter of 09/10/23 (from the past 48 hours)  Lactic acid, plasma     Status: None   Collection Time: 09/10/23  6:50 PM   Result Value Ref Range   Lactic Acid, Venous 0.8 0.5 - 1.9 mmol/L    Comment: Performed at [facility], [address], [place], [place] [address]  Comprehensive metabolic panel     Status: Abnormal   Collection Time: 09/10/23  6:50 PM  Result Value Ref Range   Sodium 134 (L) 135 - 145 mmol/L   Potassium 3.7 3.5 - 5.1 mmol/L   Chloride 104 98 - 111 mmol/L   CO2 21 (L) 22 - 32 mmol/L   Glucose, Bld 102 (H) 70 - 99 mg/dL    Comment: Glucose reference range applies only to samples taken after fasting for at least 8 hours.   BUN 19 8 - 23 mg/dL   Creatinine, Ser 1.91 0.44 - 1.00 mg/dL   Calcium 8.7 (L) 8.9 - 10.3 mg/dL   Total Protein 6.8 6.5 - 8.1 g/dL   Albumin 3.3 (L) 3.5 - 5.0 g/dL   AST 19 15 - 41 U/L   ALT 11 0 - 44 U/L   Alkaline Phosphatase 64 38 - 126 U/L   Total Bilirubin 0.7 <1.2 mg/dL   GFR, Estimated >47 >82 mL/min    Comment: (NOTE) Calculated using the CKD-EPI Creatinine Equation (2021)    Anion gap 9 5 - 15    Comment: Performed at [facility], [address] [place], [place] [address]  CBC with Differential     Status: Abnormal   Collection Time: 09/10/23  6:50 PM  Result Value Ref Range   WBC 9.6 4.0 - 10.5 K/uL   RBC 3.74 (L) 3.87 - 5.11 MIL/uL   Hemoglobin 10.5 (L) 12.0 - 15.0 g/dL   HCT 21.3 (L) 08.6 - 57.8 %   MCV 86.1 80.0 - 100.0 fL   MCH 28.1 26.0 - 34.0 pg   MCHC 32.6 30.0 - 36.0 g/dL   RDW 46.9 62.9 - 52.8 %   Platelets 327 150 - 400 K/uL   nRBC 0.0 0.0 - 0.2 %   Neutrophils Relative % 62 %   Neutro Abs 6.1 1.7 - 7.7 K/uL   Lymphocytes Relative 19 %   Lymphs Abs 1.8 0.7 - 4.0 K/uL   Monocytes Relative 15 %   Monocytes Absolute 1.5 (H) 0.1 - 1.0 K/uL   Eosinophils Relative 3 %   Eosinophils Absolute 0.2 0.0 - 0.5 K/uL   Basophils Relative 1 %   Basophils Absolute 0.1 0.0 - 0.1 K/uL   Immature Granulocytes 0 %   Abs Immature Granulocytes 0.04 0.00 - 0.07 K/uL    Comment: Performed at [facility], [address], [place], [place] [address]  Urinalysis, w/ Reflex to Culture (Infection Suspected) -Urine, Clean Catch     Status: Abnormal   Collection Time: 09/10/23  9:38 PM  Result Value Ref Range   Specimen Source URINE, CLEAN CATCH    Color, Urine YELLOW YELLOW   APPearance HAZY (A) CLEAR   Specific Gravity, Urine 1.032 (H) 1.005 - 1.030   pH 5.5 5.0 - 8.0   Glucose, UA NEGATIVE NEGATIVE mg/dL   Hgb urine dipstick NEGATIVE NEGATIVE   Bilirubin Urine NEGATIVE NEGATIVE   Ketones, ur 15 (A) NEGATIVE mg/dL   Protein, ur 30 (A) NEGATIVE mg/dL   Nitrite NEGATIVE NEGATIVE   Leukocytes,Ua SMALL (A) NEGATIVE   RBC / HPF 0-5 0 - 5 RBC/hpf   WBC, UA 6-10 0 - 5 WBC/hpf    Comment:        Reflex urine culture not performed if WBC <=10, OR if Squamous epithelial cells >5. If Squamous epithelial cells >5 suggest recollection.    Bacteria, UA RARE (A) NONE SEEN   Squamous Epithelial / HPF 6-10 0 - 5 /HPF   Mucus PRESENT    Hyaline Casts, UA PRESENT     Comment: Performed at [facility], [address] [place], [place] [address]  Blood culture (routine x 2)     Status: None (Preliminary result)   Collection Time: 09/11/23 12:06 AM   Specimen: BLOOD RIGHT ARM  Result Value Ref Range   Specimen Description BLOOD RIGHT ARM    Special Requests      BOTTLES DRAWN AEROBIC AND ANAEROBIC Blood Culture adequate volume Performed at [facility], [address] [place], [place] [address]    Culture PENDING    Report Status PENDING   Blood culture (routine x 2)     Status: None (Preliminary result)   Collection Time: 09/11/23 12:35 AM   Specimen: BLOOD LEFT ARM  Result Value Ref Range   Specimen Description BLOOD LEFT ARM    Special Requests      BOTTLES DRAWN AEROBIC AND ANAEROBIC Blood Culture adequate volume Performed at [facility], [address], [place], [place] [address]    Culture PENDING    Report  Status PENDING    Lactic acid, plasma     Status: None   Collection Time: 09/11/23  1:19 PM  Result Value Ref Range   Lactic Acid, Venous 0.8 0.5 - 1.9 mmol/L    Comment: Performed at [facility], [address] [place], [place] [address]    CT ABDOMEN PELVIS W CONTRAST Result Date: 09/10/2023 CLINICAL DATA:  Diarrhea EXAM: CT ABDOMEN AND PELVIS WITH CONTRAST TECHNIQUE: Multidetector CT imaging of the abdomen and pelvis was performed using the standard protocol following bolus administration of intravenous contrast. RADIATION DOSE REDUCTION: This exam was performed according to the departmental dose-optimization program which includes automated exposure control, adjustment of the mA and/or kV according to patient size and/or use of iterative reconstruction technique. CONTRAST:  OMNIPAQUE IOHEXOL 300 MG/ML  SOLN COMPARISON:  CT 08/22/2023 FINDINGS: Lower chest: Lung bases are clear. Hepatobiliary: Subcentimeter hypodensities within the left hepatic lobe too small to further characterize. Distended gallbladder with stones. Mild intra and extrahepatic biliary dilatation, common bile duct measuring up to 11 mm. Pancreas: Unremarkable. No pancreatic ductal dilatation or surrounding inflammatory changes. Spleen: Normal in size without focal abnormality. Adrenals/Urinary Tract: Adrenal glands are normal. Kidneys show no hydronephrosis. Bilateral renal cysts for which no specific imaging follow-up is recommended. The bladder is unremarkable Stomach/Bowel: The stomach is nonenlarged. No dilated small bowel. Diffuse colon wall thickening and mucosal enhancement consistent with pancolitis. Vascular/Lymphatic: Mild aortic atherosclerosis. No aneurysm. No suspicious lymph nodes. Reproductive: Uterus and bilateral adnexa are unremarkable. Other: Negative for pelvic effusion or free air. Musculoskeletal: No acute or suspicious osseous abnormality. IMPRESSION: 1. Findings consistent with pancolitis, presumably  related to history of C difficile colitis. No perforation or abscess. 2. Distended gallbladder with stones. Mild intra and extrahepatic biliary dilatation, common bile duct measuring up to 11 mm. Correlate with LFTs with follow-up MRCP as indicated. Aortic Atherosclerosis (ICD10-I70.0). Electronically Signed   By: Jasmine Pang M.D.   On: 09/10/2023 23:36    ROS negative except above Blood pressure (!) 144/60, pulse 67, temperature 98 F (36.7 C), temperature source Oral, resp. rate 15, SpO2 97%. Physical Exam vital signs stable afebrile no acute distress abdomen is soft nontender BUN and creatinine normal liver test normal hemoglobin slight drop white count okay CT reviewed  Assessment/Plan: [name] and abnormal CT Plan: Would proceed with an MRCP to rule out CBD stones will repeat stool studies and agree with slow weaning of vancomycin as ordered and you could consult ID for other recommendations and will check on tomorrow and please call me sooner if other GI questions or problems  Desiree James 09/11/2023, 2:10 PM

## 2023-09-11 NOTE — Plan of Care (Signed)
TBD

## 2023-09-11 NOTE — ED Notes (Signed)
Home med rec is UTD per pt, MD notified.

## 2023-09-11 NOTE — ED Notes (Signed)
Desiree James at [facility] called for transport 11:55

## 2023-09-12 ENCOUNTER — Other Ambulatory Visit (HOSPITAL_COMMUNITY): Payer: Self-pay

## 2023-09-12 ENCOUNTER — Telehealth (HOSPITAL_COMMUNITY): Payer: Self-pay | Admitting: Pharmacy Technician

## 2023-09-12 DIAGNOSIS — A0471 Enterocolitis due to Clostridium difficile, recurrent: Secondary | ICD-10-CM | POA: Diagnosis not present

## 2023-09-12 DIAGNOSIS — I10 Essential (primary) hypertension: Secondary | ICD-10-CM | POA: Diagnosis not present

## 2023-09-12 DIAGNOSIS — A0472 Enterocolitis due to Clostridium difficile, not specified as recurrent: Secondary | ICD-10-CM | POA: Diagnosis not present

## 2023-09-12 DIAGNOSIS — K829 Disease of gallbladder, unspecified: Secondary | ICD-10-CM

## 2023-09-12 DIAGNOSIS — D649 Anemia, unspecified: Secondary | ICD-10-CM

## 2023-09-12 DIAGNOSIS — I480 Paroxysmal atrial fibrillation: Secondary | ICD-10-CM | POA: Diagnosis not present

## 2023-09-12 LAB — COMPREHENSIVE METABOLIC PANEL
ALT: 11 U/L (ref 0–44)
AST: 10 U/L — ABNORMAL LOW (ref 15–41)
Albumin: 2.5 g/dL — ABNORMAL LOW (ref 3.5–5.0)
Alkaline Phosphatase: 61 U/L (ref 38–126)
Anion gap: 8 (ref 5–15)
BUN: 16 mg/dL (ref 8–23)
CO2: 19 mmol/L — ABNORMAL LOW (ref 22–32)
Calcium: 8.3 mg/dL — ABNORMAL LOW (ref 8.9–10.3)
Chloride: 110 mmol/L (ref 98–111)
Creatinine, Ser: 0.76 mg/dL (ref 0.44–1.00)
GFR, Estimated: >60 mL/min (ref >60)
Glucose, Bld: 95 mg/dL (ref 70–99)
Potassium: 3.5 mmol/L (ref 3.5–5.1)
Sodium: 137 mmol/L (ref 135–145)
Total Bilirubin: 0.5 mg/dL (ref <1.2)
Total Protein: 5.8 g/dL — ABNORMAL LOW (ref 6.5–8.1)

## 2023-09-12 LAB — GASTROINTESTINAL PANEL BY PCR, STOOL (REPLACES STOOL CULTURE)

## 2023-09-12 LAB — CBC
HCT: 29.1 % — ABNORMAL LOW (ref 36.0–46.0)
Hemoglobin: 8.9 g/dL — ABNORMAL LOW (ref 12.0–15.0)
MCH: 27.4 pg (ref 26.0–34.0)
MCHC: 30.6 g/dL (ref 30.0–36.0)
MCV: 89.5 fL (ref 80.0–100.0)
Platelets: 285 10*3/uL (ref 150–400)
RBC: 3.25 MIL/uL — ABNORMAL LOW (ref 3.87–5.11)
RDW: 14.6 % (ref 11.5–15.5)
WBC: 8.7 10*3/uL (ref 4.0–10.5)
nRBC: 0 % (ref 0.0–0.2)

## 2023-09-12 LAB — HIV ANTIBODY (ROUTINE TESTING W REFLEX): HIV Screen 4th Generation wRfx: NONREACTIVE

## 2023-09-12 MED ORDER — POTASSIUM CHLORIDE CRYS ER 20 MEQ PO TBCR
40.0000 meq | EXTENDED_RELEASE_TABLET | Freq: Three times a day (TID) | ORAL | Status: AC
  Administered 2023-09-12 (×2): 40 meq via ORAL
  Filled 2023-09-12 (×2): qty 2

## 2023-09-12 MED ORDER — POTASSIUM CHLORIDE IN NACL 40-0.9 MEQ/L-% IV SOLN
INTRAVENOUS | Status: DC
  Filled 2023-09-12 (×2): qty 1000

## 2023-09-12 MED ORDER — FIDAXOMICIN 200 MG PO TABS
200.0000 mg | ORAL_TABLET | Freq: Two times a day (BID) | ORAL | Status: DC
  Administered 2023-09-12 – 2023-09-16 (×9): 200 mg via ORAL
  Filled 2023-09-12 (×10): qty 1

## 2023-09-12 MED ORDER — METHOCARBAMOL 500 MG PO TABS: 500.0000 mg | ORAL_TABLET | Freq: Three times a day (TID) | ORAL | Status: DC | PRN

## 2023-09-12 NOTE — Consult Note (Signed)
Date of Admission:  09/10/2023          Reason for Consult: Recurrent C difficile colitis    Referring Provider: Osvaldo Shipper, MD   Assessment:  Recurrent [name] colitis Recent norovirus infection  atrial fibrillation Hypertension  Plan:  Switch to Dificid which we can obtain for free for her and give her a 10-day course I have arranged HSFU with Dr. Renold Don We will endeavor to obtain zinplava infusion as an outpatient   Desiree James [facility] has an appointment on 09/26/2022 at 3PM with Dr. Renold Don at  [facility], which  is located in the [facility] at  [address] in [place].  [address], which is located to the left of the elevators.  Phone: [phone]  Fax: [phone]  [email or web address]  The patient should arrive 30 minutes prior to their appoitment.   Principal Problem:   Clostridium difficile colitis Active Problems:   Essential hypertension   Mixed hyperlipidemia   PAF (paroxysmal atrial fibrillation) (HCC)   Major depression in full remission (HCC)   Hypokalemia   Gallbladder disease   Normocytic anemia   Scheduled Meds:  busPIRone  5 mg Oral BID   ezetimibe  10 mg Oral Daily   ferrous sulfate  325 mg Oral Daily   fidaxomicin  200 mg Oral BID   labetalol  200 mg Oral BID   losartan  100 mg Oral Daily   potassium chloride  40 mEq Oral TID   rivaroxaban  20 mg Oral Q supper   [START ON 09/13/2023] rosuvastatin  10 mg Oral Once per day on Monday Thursday   sertraline  50 mg Oral Daily   Continuous Infusions:  0.9 % NaCl with KCl 40 mEq / L 75 mL/hr at 09/12/23 1204   PRN Meds:.acetaminophen **OR** acetaminophen, LORazepam, methocarbamol, ondansetron **OR** ondansetron (ZOFRAN) IV  HPI: Desiree James is a 72 y.o. female  female with medical history significant of retrognathia, hypertension, hyperlipidemia, depression in remission, osteopenia, vitamin D  deficiency, paroxysmal atrial fibrillation on rivaroxaban,  sick sinus syndrome, pacemaker initially developed diarrheal illness while in [facility] where she was seen at an ER and they ran test but did not come up with a diagnosis--this appears to have been in late October.  Discharged on Zofran and Lomotil.   Then had [name] testing as outpatient which was negative.  She ultimately came to the ER on 24 November point time she had a GI pathogen panel that was positive for norovirus with [name] antigen positive toxin negative but PCR positive  Prescription of 10 days of vancomycin which she took and completed.  By the time she completed the vancomycin her diarrhea which had still persisted worsened.  In the interim she then developed chills and rigors.  She therefore came to the emerged department for further evaluation.  The CT scan performed here which showed evidence of pancolitis.  [name] testing now was antigen positive toxin positive.  She has been started back on vancomycin.  As we can obtain Dificid for free for her and so we will plan on giving her a 10-day supply of Dificid.  I will also schedule her in follow-up with [place] in the outpatient world so that we can arrange for an infusion of Zinplava  Avoid high risk antibiotics such as ciprofloxacin levofloxacin clindamycin and third-generation cephalosporins if possible.    I have personally spent  84 minutes involved in face-to-face and non-face-to-face activities for this patient on the day of the visit. Professional time spent includes the following activities: Preparing to see the patient (review of tests), Obtaining and/or reviewing separately obtained history (admission/discharge record), Performing a medically appropriate examination and/or evaluation , Ordering medications/tests/procedures, referring and communicating with other health care professionals, Documenting clinical information in the EMR,  Independently interpreting results (not separately reported), Communicating results to the patient/family/caregiver, Counseling and educating the patient/family/caregiver and Care coordination (not separately reported).    Review of Systems: ROS  Past Medical History:  Diagnosis Date   Hypertension    Pacemaker Dual chamber Pacemaker Assurity 04/03/2022   05/09/2022   Sinus node dysfunction (HCC) 06/11/2022    Social History   Tobacco Use   Smoking status: Never   Smokeless tobacco: Never  Vaping Use   Vaping status: Never Used  Substance Use Topics   Alcohol use: Yes    Comment: occ   Drug use: Never    Family History  Problem Relation Age of Onset   Stroke Mother    Stroke Father    Pneumonia Father    Breast cancer Sister    Pneumonia Brother    Cancer Brother    Allergies  Allergen Reactions   Morphine Rash    OBJECTIVE: Blood pressure (!) 145/49, pulse 65, temperature 98.2 F (36.8 C), temperature source Oral, resp. rate 18, height 5\' 4"  (1.626 m), weight 96.6 kg, SpO2 98%.  Physical Exam  Lab Results Lab Results  Component Value Date   WBC 8.7 09/12/2023   HGB 8.9 (L) 09/12/2023   HCT 29.1 (L) 09/12/2023   MCV 89.5 09/12/2023   PLT 285 09/12/2023    Lab Results  Component Value Date   CREATININE 0.76 09/12/2023   BUN 16 09/12/2023   NA 137 09/12/2023   K 3.5 09/12/2023   CL 110 09/12/2023   CO2 19 (L) 09/12/2023    Lab Results  Component Value Date   ALT 11 09/12/2023   AST 10 (L) 09/12/2023   ALKPHOS 61 09/12/2023   BILITOT 0.5 09/12/2023     Microbiology: Recent Results (from the past 240 hours)  Blood culture (routine x 2)     Status: None (Preliminary result)   Collection Time: 09/11/23 12:06 AM   Specimen: BLOOD RIGHT ARM  Result Value Ref Range Status   Specimen Description BLOOD RIGHT ARM  Final   Special Requests   Final    BOTTLES DRAWN AEROBIC AND ANAEROBIC Blood Culture adequate volume   Culture   Final    NO GROWTH < 24  HOURS Performed at [facility], [address], [place], [place] [address]    Report Status PENDING  Incomplete  Blood culture (routine x 2)     Status: None (Preliminary result)   Collection Time: 09/11/23 12:35 AM   Specimen: BLOOD LEFT ARM  Result Value Ref Range Status   Specimen Description BLOOD LEFT ARM  Final   Special Requests   Final    BOTTLES DRAWN AEROBIC AND ANAEROBIC Blood Culture adequate volume   Culture   Final    NO GROWTH < 24 HOURS Performed at [facility], [address] [place], [place] [address]    Report Status PENDING  Incomplete  C Difficile Quick Screen w PCR reflex     Status: Abnormal   Collection Time: 09/11/23  2:35 PM   Specimen: Stool  Result Value Ref Range Status  C Diff antigen POSITIVE (A) NEGATIVE Final   C Diff toxin POSITIVE (A) NEGATIVE Final   C Diff interpretation Toxin producing [name] detected.  Final    Comment: CRITICAL RESULT CALLED TO, READ BACK BY AND VERIFIED WITH: Janice Norrie. RN ON 09/11/2023 1535 BY GOLSON M. Performed at [facility], [address] [place], [place] [address]     Acey Lav, MD [facility] for Infectious Disease [facility] [phone] pager  09/12/2023, 12:31 PM

## 2023-09-12 NOTE — TOC Initial Note (Signed)
Transition of Care [facility]) - Initial/Assessment Note    Patient Details  Name: Desiree James MRN: [id number] Date of Birth: 02/28/1951  Transition [facility]) CM/SW Contact:    Lanier Clam, RN Phone Number: 09/12/2023, 1:58 PM  Clinical Narrative: d/c plan home.                    Barriers to Discharge: Continued Medical Work up   Patient Goals and CMS Choice            Expected Discharge Plan and Services                                              Prior Living Arrangements/Services                       Activities of Daily Living   ADL Screening (condition at time of admission) Independently performs ADLs?: Yes (appropriate for developmental age) Is the patient deaf or have difficulty hearing?: No Does the patient have difficulty seeing, even when wearing glasses/contacts?: No Does the patient have difficulty concentrating, remembering, or making decisions?: No  Permission Sought/Granted                  Emotional Assessment              Admission diagnosis:  Colitis [K52.9] Clostridium difficile colitis [A04.72] Patient Active Problem List   Diagnosis Date Noted   Clostridium difficile colitis 09/11/2023   Pure hypercholesterolemia 09/11/2023   Viral syndrome 09/11/2023   Vitamin D deficiency 09/11/2023   Osteopenia 09/11/2023   Major depression in full remission (HCC) 09/11/2023   Hypokalemia 09/11/2023   Gallbladder disease 09/11/2023   Normocytic anemia 09/11/2023   PAF (paroxysmal atrial fibrillation) (HCC) 10/16/2022   Encounter for care of pacemaker 08/08/2022   Mixed hyperlipidemia 07/17/2022   Sinus node dysfunction (HCC) 06/11/2022   Essential hypertension 06/11/2022   Bradycardia with 31-40 beats per minute 05/09/2022   Retrognathia 05/09/2022   Pacemaker Abbott Assurity Dual chamber Pacemaker 04/03/2022 04/03/2022   PCP:  Adrian Prince, MD Pharmacy:   [id number] - [place], [place] - [address] AT [address] [facility] [address] [place] [address] Phone: [phone] Fax: [phone]     Social Drivers of Health (SDOH) Social History: SDOH Screenings   Food Insecurity: No Food Insecurity (09/12/2023)  Housing: Low Risk  (09/12/2023)  Transportation Needs: No Transportation Needs (09/12/2023)  Utilities: Not At Risk (09/12/2023)  Tobacco Use: Low Risk  (09/11/2023)   SDOH Interventions:     Readmission Risk Interventions     No data to display

## 2023-09-12 NOTE — TOC Benefit Eligibility Note (Deleted)
Patient Product/process development scientist completed.    The patient is insured through [employer]. Patient has Medicare and is not eligible for a copay card, but may be able to apply for patient assistance, if available.    Ran test claim for Dificid 200 mg and the current 10 day co-pay is $1,209.80.   This test claim was processed through [facility]- copay amounts may vary at other pharmacies due to pharmacy/plan contracts, or as the patient moves through the different stages of their insurance plan.     Roland Earl, CPHT Pharmacy Technician III Certified Patient Advocate [facility] Patient Advocate Team Direct Number: [phone]  Fax: [phone]

## 2023-09-12 NOTE — Plan of Care (Signed)

## 2023-09-12 NOTE — Telephone Encounter (Signed)
Patient Advocate Encounter  Patient is approved through the [employer] Patient Assistance Program for Dificid through 09/24/2024.   Medication will be sent to patient's home   Roland Earl, CPhT Pharmacy Patient Advocate Specialist [facility] Patient Advocate Team Direct Number: [phone]  Fax: [phone]

## 2023-09-12 NOTE — Progress Notes (Signed)
Desiree James 9:11 AM  Subjective: Patient doing okay tolerating diet still having some diarrhea no new complaints and no pain and case discussed with the hospital team as well as the patient's nurse and discussed ambulating in the halls  Objective: Vital signs stable afebrile no acute distress abdomen is soft nontender chemistry is okay CBC okay except for slight hemoglobin drop  Assessment: [name] and slight dilated CBD in patient with gallstones  Plan: Because of her pacemaker she may be going over [facility] today to have the MRCP and if so I will check on that when the report is available otherwise okay with me since she is asymptomatic from a gallbladder standpoint to have that as an outpatient and if no better in a day or 2 consider adding Questran or colestipol to her regimen and remind her not to take it within an hour of her other medicines and please call me if I can be of any further assistance with this hospital stay otherwise happy to see back as an outpatient to monitor her [name] treatment and make sure no further workup plans or medicine changes are needed  [facility]  office [phone] After 5PM or if no answer call [phone]

## 2023-09-12 NOTE — Progress Notes (Signed)
[facility] PROGRESS NOTE   Desiree James [facility] QQV:[id number] DOB: [birth year] DOA: 09/10/2023  PCP: Adrian Prince, MD  Brief History: 72 y.o. female with medical history significant of retrognathia, hypertension, hyperlipidemia, depression in remission, osteopenia, vitamin D deficiency, paroxysmal atrial fibrillation on rivaroxaban,  sick sinus syndrome, pacemaker placements recently diagnosed with [name] colitis who finished vancomycin 3 days ago, but returned to the ED due to multiple episodes of diarrhea and fever.    Consultants: Gastroenterology.  Procedures: None yet    Subjective/Interval History: Continues to have loose stools which are about moderate in amount.  Denies any blood in the stool.  Had about 3-4 episodes during the course of the night.  Denies any abdominal pain nausea or vomiting.    Assessment/Plan:  Recurrent [name] colitis Patient was originally diagnosed with [name] on 08/15/2023.  It looks like she was treated with 10 days of vancomycin.  She completed the course of antibiotics however within a few days after cessation she started developing diarrhea again.  Came back with worsening symptoms. CT scan showed pancolitis.  [name] testing returned positive for [name] antigen and [name] toxin. Patient was started back on vancomycin. Since this is recurrent [name] she might be a candidate for fidaxomicin.  Will consult ID to assist.  Severe hypokalemia Supplemented.  Continue potassium through IV fluids.  Will give additional dose orally today.  Normocytic anemia Drop in hemoglobin is likely dilutional.  No evidence of overt bleeding. Check anemia panel.  Gallstones with intra and extrahepatic biliary dilatation which is mild. Her bilirubin and alkaline phosphatase levels are normal.  She is nontender in the right upper quadrant.  AST ALT are unremarkable.  This is somewhat of an incidental finding.  Initially  plan was to do MRCP however patient has a pacemaker.  This creates logistical issues since she will have to be transferred to another facility.  After further discussions with gastroenterology appears to be reasonable to proceed with this in the outpatient setting.  Will cancel MRCP.  Paroxysmal atrial fibrillation Noted to be on labetalol for rate control.  Noted to be on Xarelto for anticoagulation.  Essential hypertension Continue home medications.  Monitor blood pressures.  History of depression Continue home medication.  Noted to be on buspirone as well as sertraline.  Hyperlipidemia Continue ezetimibe as well as rosuvastatin.  Obesity Estimated body mass index is 36.9 kg/m as calculated from the following:   Height as of 08/19/23: 5\' 4"  (1.626 m).   Weight as of 08/19/23: 97.5 kg.   DVT Prophylaxis: On rivaroxaban Code Status: Full code Family Communication: Discussed with patient Disposition Plan: Hopefully return home when improved  Status is: Inpatient Remains inpatient appropriate because: Recurrent [name]      Medications: Scheduled:  busPIRone  5 mg Oral BID   ezetimibe  10 mg Oral Daily   ferrous sulfate  325 mg Oral Daily   labetalol  200 mg Oral BID   losartan  100 mg Oral Daily   potassium chloride  40 mEq Oral TID   rivaroxaban  20 mg Oral Q supper   [START ON 09/13/2023] rosuvastatin  10 mg Oral Once per day on Monday Thursday   sertraline  50 mg Oral Daily   vancomycin  125 mg Oral QID   Followed by   Desiree James ON 09/25/2023] vancomycin  125 mg Oral BID   Followed by   Desiree James ON 10/03/2023] vancomycin  125 mg Oral Daily  Followed by   Desiree James ON 10/10/2023] vancomycin  125 mg Oral QODAY   Followed by   Desiree James ON 10/18/2023] vancomycin  125 mg Oral Q3 days   Continuous:  0.9 % NaCl with KCl 40 mEq / L     ZOX:[id number] **OR** acetaminophen, LORazepam, ondansetron **OR** ondansetron (ZOFRAN) IV  Antibiotics: Anti-infectives (From  admission, onward)    Start     Dose/Rate Route Frequency Ordered Stop   10/18/23 1000  vancomycin (VANCOCIN) capsule 125 mg       Placed in "Followed by" Linked Group   125 mg Oral Every 3 DAYS 09/11/23 1357 10/27/23 0959   10/10/23 1000  vancomycin (VANCOCIN) capsule 125 mg       Placed in "Followed by" Linked Group   125 mg Oral Every other day 09/11/23 1357 10/18/23 0959   10/03/23 1000  vancomycin (VANCOCIN) capsule 125 mg       Placed in "Followed by" Linked Group   125 mg Oral Daily 09/11/23 1357 10/10/23 0959   09/25/23 2200  vancomycin (VANCOCIN) capsule 125 mg       Placed in "Followed by" Linked Group   125 mg Oral 2 times daily 09/11/23 1357 10/02/23 2159   09/11/23 1500  vancomycin (VANCOCIN) capsule 125 mg       Placed in "Followed by" Linked Group   125 mg Oral 4 times daily 09/11/23 1357 09/25/23 1359   09/11/23 0000  metroNIDAZOLE (FLAGYL) IVPB 500 mg        500 mg 100 mL/hr over 60 Minutes Intravenous  Once 09/10/23 2346 09/11/23 0133       Objective:  Vital Signs  Vitals:   09/11/23 1739 09/11/23 2017 09/12/23 0549 09/12/23 0852  BP: (!) 152/72 (!) 149/70 (!) 121/55 131/68  Pulse: 67 66 64 64  Resp: 19 19 17 18   Temp: 98.3 F (36.8 C) 97.8 F (36.6 C) 97.7 F (36.5 C) 98 F (36.7 C)  TempSrc: Oral Oral Oral Oral  SpO2: 99% 99% 98% 97%    Intake/Output Summary (Last 24 hours) at 09/12/2023 1042 Last data filed at 09/12/2023 1478 Gross per 24 hour  Intake 1125.98 ml  Output 0 ml  Net 1125.98 ml   There were no vitals filed for this visit.  General appearance: Awake alert.  In no distress Resp: Clear to auscultation bilaterally.  Normal effort Cardio: S1-S2 is normal regular.  No S3-S4.  No rubs murmurs or bruit GI: Abdomen is soft.  Nontender nondistended.  Bowel sounds are present normal.  No masses organomegaly Extremities: No edema.  Full range of motion of lower extremities. Neurologic: Alert and oriented x3.  No focal neurological  deficits.    Lab Results:  Data Reviewed: I have personally reviewed following labs and reports of the imaging studies  CBC: Recent Labs  Lab 09/10/23 1850 09/11/23 1319 09/12/23 0503  WBC 9.6 9.4 8.7  NEUTROABS 6.1 5.8  --   HGB 10.5* 10.0* 8.9*  HCT 32.2* 30.6* 29.1*  MCV 86.1 86.9 89.5  PLT 327 281 285    Basic Metabolic Panel: Recent Labs  Lab 09/10/23 1850 09/11/23 1426 09/12/23 0503  NA 134* 136 137  K 3.7 2.9* 3.5  CL 104 107 110  CO2 21* 21* 19*  GLUCOSE 102* 87 95  BUN 19 17 16   CREATININE 0.88 0.71 0.76  CALCIUM 8.7* 8.5* 8.3*  MG  --  1.8  --     GFR: CrCl cannot be calculated (Unknown ideal weight.).  Liver Function Tests: Recent Labs  Lab 09/10/23 1850 09/11/23 1426 09/12/23 0503  AST 19 14* 10*  ALT 11 13 11   ALKPHOS 64 68 61  BILITOT 0.7 0.6 0.5  PROT 6.8 6.2* 5.8*  ALBUMIN 3.3* 2.7* 2.5*     Recent Results (from the past 240 hours)  Blood culture (routine x 2)     Status: None (Preliminary result)   Collection Time: 09/11/23 12:06 AM   Specimen: BLOOD RIGHT ARM  Result Value Ref Range Status   Specimen Description BLOOD RIGHT ARM  Final   Special Requests   Final    BOTTLES DRAWN AEROBIC AND ANAEROBIC Blood Culture adequate volume   Culture   Final    NO GROWTH < 24 HOURS Performed at [facility], [address], [place], [place] [address]    Report Status PENDING  Incomplete  Blood culture (routine x 2)     Status: None (Preliminary result)   Collection Time: 09/11/23 12:35 AM   Specimen: BLOOD LEFT ARM  Result Value Ref Range Status   Specimen Description BLOOD LEFT ARM  Final   Special Requests   Final    BOTTLES DRAWN AEROBIC AND ANAEROBIC Blood Culture adequate volume   Culture   Final    NO GROWTH < 24 HOURS Performed at [facility], [address] [place], [place] [address]    Report Status PENDING  Incomplete  C Difficile Quick Screen w PCR reflex     Status: Abnormal   Collection Time: 09/11/23   2:35 PM   Specimen: Stool  Result Value Ref Range Status   C Diff antigen POSITIVE (A) NEGATIVE Final   C Diff toxin POSITIVE (A) NEGATIVE Final   C Diff interpretation Toxin producing [name] detected.  Final    Comment: CRITICAL RESULT CALLED TO, READ BACK BY AND VERIFIED WITH: Janice Norrie. RN ON 09/11/2023 1535 BY GOLSON M. Performed at [facility], [address], [place], [place] [address]       Radiology Studies: CT ABDOMEN PELVIS W CONTRAST Result Date: 09/10/2023 CLINICAL DATA:  Diarrhea EXAM: CT ABDOMEN AND PELVIS WITH CONTRAST TECHNIQUE: Multidetector CT imaging of the abdomen and pelvis was performed using the standard protocol following bolus administration of intravenous contrast. RADIATION DOSE REDUCTION: This exam was performed according to the departmental dose-optimization program which includes automated exposure control, adjustment of the mA and/or kV according to patient size and/or use of iterative reconstruction technique. CONTRAST:  OMNIPAQUE IOHEXOL 300 MG/ML  SOLN COMPARISON:  CT 08/22/2023 FINDINGS: Lower chest: Lung bases are clear. Hepatobiliary: Subcentimeter hypodensities within the left hepatic lobe too small to further characterize. Distended gallbladder with stones. Mild intra and extrahepatic biliary dilatation, common bile duct measuring up to 11 mm. Pancreas: Unremarkable. No pancreatic ductal dilatation or surrounding inflammatory changes. Spleen: Normal in size without focal abnormality. Adrenals/Urinary Tract: Adrenal glands are normal. Kidneys show no hydronephrosis. Bilateral renal cysts for which no specific imaging follow-up is recommended. The bladder is unremarkable Stomach/Bowel: The stomach is nonenlarged. No dilated small bowel. Diffuse colon wall thickening and mucosal enhancement consistent with pancolitis. Vascular/Lymphatic: Mild aortic atherosclerosis. No aneurysm. No suspicious lymph nodes. Reproductive: Uterus and  bilateral adnexa are unremarkable. Other: Negative for pelvic effusion or free air. Musculoskeletal: No acute or suspicious osseous abnormality. IMPRESSION: 1. Findings consistent with pancolitis, presumably related to history of C difficile colitis. No perforation or abscess. 2. Distended gallbladder with stones. Mild intra and extrahepatic biliary  dilatation, common bile duct measuring up to 11 mm. Correlate with LFTs with follow-up MRCP as indicated. Aortic Atherosclerosis (ICD10-I70.0). Electronically Signed   By: Jasmine Pang M.D.   On: 09/10/2023 23:36       LOS: 1 day   Osvaldo Shipper  [facility] Pager on [email or web address]  09/12/2023, 10:42 AM

## 2023-09-12 NOTE — Telephone Encounter (Signed)
Patient Product/process development scientist completed.    The patient is insured through [employer]. Patient has Medicare and is not eligible for a copay card, but may be able to apply for patient assistance, if available.    Ran test claim for Dificid 200 mg and the current 10 day co-pay is $1,209.80.   This test claim was processed through [facility]- copay amounts may vary at other pharmacies due to pharmacy/plan contracts, or as the patient moves through the different stages of their insurance plan.     Roland Earl, CPHT Pharmacy Technician III Certified Patient Advocate [facility] Patient Advocate Team Direct Number: [phone]  Fax: [phone]

## 2023-09-12 NOTE — Progress Notes (Signed)
Mobility Specialist - Progress Note   09/12/23 0901  Mobility  Activity Ambulated independently in hallway  Level of Assistance Independent  Assistive Device None  Distance Ambulated (ft) 500 ft  Activity Response Tolerated well  Mobility Referral Yes  Mobility visit 1 Mobility  Mobility Specialist Start Time (ACUTE ONLY) 0849  Mobility Specialist Stop Time (ACUTE ONLY) 0900  Mobility Specialist Time Calculation (min) (ACUTE ONLY) 11 min   Pt received in bed and agreeable to mobility. No complaints during session. Pt to recliner after session with all needs met.    [facility]

## 2023-09-13 DIAGNOSIS — I480 Paroxysmal atrial fibrillation: Secondary | ICD-10-CM | POA: Diagnosis not present

## 2023-09-13 DIAGNOSIS — E782 Mixed hyperlipidemia: Secondary | ICD-10-CM

## 2023-09-13 DIAGNOSIS — A0472 Enterocolitis due to Clostridium difficile, not specified as recurrent: Secondary | ICD-10-CM | POA: Diagnosis not present

## 2023-09-13 DIAGNOSIS — A0471 Enterocolitis due to Clostridium difficile, recurrent: Secondary | ICD-10-CM | POA: Diagnosis not present

## 2023-09-13 DIAGNOSIS — I10 Essential (primary) hypertension: Secondary | ICD-10-CM | POA: Diagnosis not present

## 2023-09-13 LAB — RETICULOCYTES
Immature Retic Fract: 15.1 % (ref 2.3–15.9)
RBC.: 3.06 MIL/uL — ABNORMAL LOW (ref 3.87–5.11)
Retic Count, Absolute: 55.1 10*3/uL (ref 19.0–186.0)
Retic Ct Pct: 1.8 % (ref 0.4–3.1)

## 2023-09-13 LAB — IRON AND TIBC
Iron: 54 ug/dL (ref 28–170)
Saturation Ratios: 27 % (ref 10.4–31.8)
TIBC: 199 ug/dL — ABNORMAL LOW (ref 250–450)
UIBC: 145 ug/dL

## 2023-09-13 LAB — COMPREHENSIVE METABOLIC PANEL
ALT: 11 U/L (ref 0–44)
AST: 10 U/L — ABNORMAL LOW (ref 15–41)
Albumin: 2.4 g/dL — ABNORMAL LOW (ref 3.5–5.0)
Alkaline Phosphatase: 57 U/L (ref 38–126)
Anion gap: 6 (ref 5–15)
BUN: 12 mg/dL (ref 8–23)
CO2: 19 mmol/L — ABNORMAL LOW (ref 22–32)
Calcium: 8.5 mg/dL — ABNORMAL LOW (ref 8.9–10.3)
Chloride: 113 mmol/L — ABNORMAL HIGH (ref 98–111)
Creatinine, Ser: 0.74 mg/dL (ref 0.44–1.00)
GFR, Estimated: >60 mL/min (ref >60)
Glucose, Bld: 86 mg/dL (ref 70–99)
Potassium: 4.3 mmol/L (ref 3.5–5.1)
Sodium: 138 mmol/L (ref 135–145)
Total Bilirubin: 0.4 mg/dL (ref <1.2)
Total Protein: 5.5 g/dL — ABNORMAL LOW (ref 6.5–8.1)

## 2023-09-13 LAB — FERRITIN: Ferritin: 224 ng/mL (ref 11–307)

## 2023-09-13 LAB — CBC
HCT: 28 % — ABNORMAL LOW (ref 36.0–46.0)
Hemoglobin: 8.5 g/dL — ABNORMAL LOW (ref 12.0–15.0)
MCH: 27.6 pg (ref 26.0–34.0)
MCHC: 30.4 g/dL (ref 30.0–36.0)
MCV: 90.9 fL (ref 80.0–100.0)
Platelets: 273 10*3/uL (ref 150–400)
RBC: 3.08 MIL/uL — ABNORMAL LOW (ref 3.87–5.11)
RDW: 15.2 % (ref 11.5–15.5)
WBC: 7.7 10*3/uL (ref 4.0–10.5)
nRBC: 0 % (ref 0.0–0.2)

## 2023-09-13 LAB — FOLATE: Folate: 17.5 ng/mL (ref >5.9)

## 2023-09-13 LAB — VITAMIN B12: Vitamin B-12: 1070 pg/mL — ABNORMAL HIGH (ref 180–914)

## 2023-09-13 LAB — MAGNESIUM: Magnesium: 2.2 mg/dL (ref 1.7–2.4)

## 2023-09-13 NOTE — Hospital Course (Addendum)
PMH of HTN, HLD, depression, PAF on Xarelto, SSS S/P permanent pacemaker implant,.  Present to the hospital with complaints of abdominal pain and diarrhea. Found to have [name] colitis.  Assessment and Plan: Recurrent [name] colitis Patient was originally diagnosed with [name] on 08/15/2023.  It looks like she was treated with 10 days of vancomycin.  She completed the course of antibiotics however within a few days after cessation she started developing diarrhea again.  Came back with worsening symptoms. CT scan showed pancolitis.  [name] testing returned positive for [name] antigen and [name] toxin. Patient was started back on vancomycin. ID was consulted. Currently on fidaxomicin. Continues to have diarrhea although improving. Initial plan was to discharge the patient on 12/20 as patient is medically stable.  Unable to verify that the patient has received her antibiotic that was prescribed earlier at home and therefore we will have to watch her until she receives the medication.   Severe hypokalemia Supplemented.    Normocytic anemia Baseline hemoglobin around 13.6. On admission hemoglobin 10.5. Currently 8.5. Drop in hemoglobin is likely dilutional.  No evidence of overt bleeding. B12 normal.  Iron normal.  Folic acid 17.5.  No further workup.  Gallstones with intra and extrahepatic biliary dilatation which is mild. Seen on the CT scan. Normal bilirubin.  Normal ALP.  Normal AST and ALT. No RUQ tenderness. Initially plan was to do MRCP however patient has a pacemaker. This created a logistical issues since she will have to be transferred to another facility.  After further discussions with gastroenterology appears to be reasonable to proceed with this in the outpatient setting.  MRCP is currently canceled.   Paroxysmal atrial fibrillation Noted to be on labetalol for rate control.  Noted to be on Xarelto for anticoagulation.   Essential  hypertension Continue home medications.  Monitor blood pressures.   History of depression Continue home medication.  Noted to be on buspirone as well as sertraline.   Hyperlipidemia Continue ezetimibe as well as rosuvastatin.   Obesity Body mass index is 36.56 kg/m.  Placing the patient at high risk of poor outcome.

## 2023-09-13 NOTE — Progress Notes (Signed)
Subjective: No new complaints   Antibiotics:  Anti-infectives (From admission, onward)    Start     Dose/Rate Route Frequency Ordered Stop   10/18/23 1000  vancomycin (VANCOCIN) capsule 125 mg  Status:  Discontinued       Placed in "Followed by" Linked Group   125 mg Oral Every 3 DAYS 09/11/23 1357 09/12/23 1216   10/10/23 1000  vancomycin (VANCOCIN) capsule 125 mg  Status:  Discontinued       Placed in "Followed by" Linked Group   125 mg Oral Every other day 09/11/23 1357 09/12/23 1216   10/03/23 1000  vancomycin (VANCOCIN) capsule 125 mg  Status:  Discontinued       Placed in "Followed by" Linked Group   125 mg Oral Daily 09/11/23 1357 09/12/23 1216   09/25/23 2200  vancomycin (VANCOCIN) capsule 125 mg  Status:  Discontinued       Placed in "Followed by" Linked Group   125 mg Oral 2 times daily 09/11/23 1357 09/12/23 1216   09/12/23 1315  fidaxomicin (DIFICID) tablet 200 mg        200 mg Oral 2 times daily 09/12/23 1216 09/22/23 0959   09/11/23 1500  vancomycin (VANCOCIN) capsule 125 mg  Status:  Discontinued       Placed in "Followed by" Linked Group   125 mg Oral 4 times daily 09/11/23 1357 09/12/23 1216   09/11/23 0000  metroNIDAZOLE (FLAGYL) IVPB 500 mg        500 mg 100 mL/hr over 60 Minutes Intravenous  Once 09/10/23 2346 09/11/23 0133       Medications: Scheduled Meds:  busPIRone  5 mg Oral BID   ezetimibe  10 mg Oral Daily   ferrous sulfate  325 mg Oral Daily   fidaxomicin  200 mg Oral BID   labetalol  200 mg Oral BID   losartan  100 mg Oral Daily   rivaroxaban  20 mg Oral Q supper   rosuvastatin  10 mg Oral Once per day on Monday Thursday   sertraline  50 mg Oral Daily   Continuous Infusions: PRN Meds:.acetaminophen **OR** acetaminophen, LORazepam, methocarbamol, ondansetron **OR** ondansetron (ZOFRAN) IV    Objective: Weight change:   Intake/Output Summary (Last 24 hours) at 09/13/2023 1045 Last data filed at 09/13/2023 1000 Gross per 24  hour  Intake 2815.84 ml  Output --  Net 2815.84 ml   Blood pressure (!) 147/75, pulse 64, temperature (!) 97.4 F (36.3 C), temperature source Oral, resp. rate 16, height 5\' 4"  (1.626 m), weight 96.6 kg, SpO2 99%. Temp:  [97.4 F (36.3 C)-98.2 F (36.8 C)] 97.4 F (36.3 C) (12/19 0601) Pulse Rate:  [62-65] 64 (12/19 0601) Resp:  [16-18] 16 (12/19 0601) BP: (108-147)/(49-75) 147/75 (12/19 0601) SpO2:  [98 %-99 %] 99 % (12/19 0601) Weight:  [96.6 kg] 96.6 kg (12/18 1200)  Physical Exam: Physical Exam Constitutional:      General: She is not in acute distress.    Appearance: She is well-developed. She is not diaphoretic.  HENT:     Head: Normocephalic and atraumatic.     Right Ear: External ear normal.     Left Ear: External ear normal.     Mouth/Throat:     Pharynx: No oropharyngeal exudate.  Eyes:     General: No scleral icterus.    Conjunctiva/sclera: Conjunctivae normal.     Pupils: Pupils are equal, round, and reactive to light.  Cardiovascular:  Rate and Rhythm: Normal rate and regular rhythm.  Pulmonary:     Effort: Pulmonary effort is normal. No respiratory distress.     Breath sounds: Normal breath sounds. No wheezing.  Abdominal:     General: There is no distension.     Palpations: Abdomen is soft.  Musculoskeletal:        General: No tenderness. Normal range of motion.  Lymphadenopathy:     Cervical: No cervical adenopathy.  Skin:    General: Skin is warm and dry.     Coloration: Skin is not pale.     Findings: No erythema or rash.  Neurological:     General: No focal deficit present.     Mental Status: She is alert and oriented to person, place, and time.     Motor: No abnormal muscle tone.     Coordination: Coordination normal.  Psychiatric:        Mood and Affect: Mood normal.        Behavior: Behavior normal.        Thought Content: Thought content normal.        Judgment: Judgment normal.      CBC:    BMET Recent Labs     09/12/23 0503 09/13/23 0522  NA 137 138  K 3.5 4.3  CL 110 113*  CO2 19* 19*  GLUCOSE 95 86  BUN 16 12  CREATININE 0.76 0.74  CALCIUM 8.3* 8.5*     Liver Panel  Recent Labs    09/12/23 0503 09/13/23 0522  PROT 5.8* 5.5*  ALBUMIN 2.5* 2.4*  AST 10* 10*  ALT 11 11  ALKPHOS 61 57  BILITOT 0.5 0.4       Sedimentation Rate No results for input(s): "ESRSEDRATE" in the last 72 hours. C-Reactive Protein No results for input(s): "CRP" in the last 72 hours.  Micro Results: Recent Results (from the past 720 hours)  C Difficile Quick Screen w PCR reflex     Status: Abnormal   Collection Time: 08/19/23  5:23 PM   Specimen: Stool  Result Value Ref Range Status   C Diff antigen POSITIVE (A) NEGATIVE Final   C Diff toxin NEGATIVE NEGATIVE Final   C Diff interpretation Results are indeterminate. See PCR results.  Final    Comment: Performed at [facility], [address], [place], [place] [address]  Gastrointestinal Panel by PCR , Stool     Status: Abnormal   Collection Time: 08/19/23  5:23 PM   Specimen: Stool  Result Value Ref Range Status   Campylobacter species NOT DETECTED NOT DETECTED Final   Plesimonas shigelloides NOT DETECTED NOT DETECTED Final   Salmonella species NOT DETECTED NOT DETECTED Final   Yersinia enterocolitica NOT DETECTED NOT DETECTED Final   Vibrio species NOT DETECTED NOT DETECTED Final   Vibrio cholerae NOT DETECTED NOT DETECTED Final   Enteroaggregative E coli (EAEC) NOT DETECTED NOT DETECTED Final   Enteropathogenic E coli (EPEC) NOT DETECTED NOT DETECTED Final   Enterotoxigenic E coli (ETEC) NOT DETECTED NOT DETECTED Final   Shiga like toxin producing E coli (STEC) NOT DETECTED NOT DETECTED Final   Shigella/Enteroinvasive E coli (EIEC) NOT DETECTED NOT DETECTED Final   Cryptosporidium NOT DETECTED NOT DETECTED Final   Cyclospora cayetanensis NOT DETECTED NOT DETECTED Final   Entamoeba histolytica NOT DETECTED NOT DETECTED Final    Giardia lamblia NOT DETECTED NOT DETECTED Final   Adenovirus F40/41 NOT DETECTED NOT DETECTED Final   Astrovirus NOT DETECTED  NOT DETECTED Final   Norovirus GI/GII DETECTED (A) NOT DETECTED Final    Comment: RESULT CALLED TO, READ BACK BY AND VERIFIED WITH: KELLEY [email or web address]  08/21/23 RH    Rotavirus A NOT DETECTED NOT DETECTED Final   Sapovirus (I, II, IV, and V) NOT DETECTED NOT DETECTED Final    Comment: Performed at [facility], [address], [place], [place] [address]  Urine Culture     Status: Abnormal   Collection Time: 08/19/23  5:23 PM   Specimen: Urine, Clean Catch  Result Value Ref Range Status   Specimen Description   Final    URINE, CLEAN CATCH Performed at [facility], [address] [place], [place] [address]    Special Requests   Final    NONE Performed at [facility], [address] [place], [place] [address]    Culture MULTIPLE SPECIES PRESENT, SUGGEST RECOLLECTION (A)  Final   Report Status 08/21/2023 FINAL  Final  C. Diff by PCR, Reflexed     Status: Abnormal   Collection Time: 08/19/23  5:23 PM  Result Value Ref Range Status   Toxigenic [name] by PCR POSITIVE (A) NEGATIVE Final    Comment: Positive for toxigenic [name] with little to no toxin production. Only treat if clinical presentation suggests symptomatic illness. Performed at [facility], [address], [place], [place] [address]   Blood culture (routine x 2)     Status: None (Preliminary result)   Collection Time: 09/11/23 12:06 AM   Specimen: BLOOD RIGHT ARM  Result Value Ref Range Status   Specimen Description BLOOD RIGHT ARM  Final   Special Requests   Final    BOTTLES DRAWN AEROBIC AND ANAEROBIC Blood Culture adequate volume   Culture   Final    NO GROWTH < 24 HOURS Performed at [facility], [address], [place], [place] [address]    Report Status PENDING  Incomplete  Blood culture (routine x 2)     Status:  None (Preliminary result)   Collection Time: 09/11/23 12:35 AM   Specimen: BLOOD LEFT ARM  Result Value Ref Range Status   Specimen Description BLOOD LEFT ARM  Final   Special Requests   Final    BOTTLES DRAWN AEROBIC AND ANAEROBIC Blood Culture adequate volume   Culture   Final    NO GROWTH < 24 HOURS Performed at [facility], [address] [place], [place] [address]    Report Status PENDING  Incomplete  Gastrointestinal Panel by PCR , Stool     Status: None   Collection Time: 09/11/23  2:35 PM   Specimen: Stool  Result Value Ref Range Status   Campylobacter species NOT DETECTED NOT DETECTED Final   Plesimonas shigelloides NOT DETECTED NOT DETECTED Final   Salmonella species NOT DETECTED NOT DETECTED Final   Yersinia enterocolitica NOT DETECTED NOT DETECTED Final   Vibrio species NOT DETECTED NOT DETECTED Final   Vibrio cholerae NOT DETECTED NOT DETECTED Final   Enteroaggregative E coli (EAEC) NOT DETECTED NOT DETECTED Final   Enteropathogenic E coli (EPEC) NOT DETECTED NOT DETECTED Final   Enterotoxigenic E coli (ETEC) NOT DETECTED NOT DETECTED Final   Shiga like toxin producing E coli (STEC) NOT DETECTED NOT DETECTED Final   Shigella/Enteroinvasive E coli (EIEC) NOT DETECTED NOT DETECTED Final   Cryptosporidium NOT DETECTED NOT DETECTED Final   Cyclospora cayetanensis NOT DETECTED NOT DETECTED Final   Entamoeba histolytica NOT DETECTED NOT  DETECTED Final   Giardia lamblia NOT DETECTED NOT DETECTED Final   Adenovirus F40/41 NOT DETECTED NOT DETECTED Final   Astrovirus NOT DETECTED NOT DETECTED Final   Norovirus GI/GII NOT DETECTED NOT DETECTED Final   Rotavirus A NOT DETECTED NOT DETECTED Final   Sapovirus (I, II, IV, and V) NOT DETECTED NOT DETECTED Final    Comment: Performed at [facility], [address] [place], [place] [address]  C Difficile Quick Screen w PCR reflex     Status: Abnormal   Collection Time: 09/11/23  2:35 PM   Specimen: Stool   Result Value Ref Range Status   C Diff antigen POSITIVE (A) NEGATIVE Final   C Diff toxin POSITIVE (A) NEGATIVE Final   C Diff interpretation Toxin producing [name] detected.  Final    Comment: CRITICAL RESULT CALLED TO, READ BACK BY AND VERIFIED WITH: Janice Norrie. RN ON 09/11/2023 1535 BY GOLSON M. Performed at [facility], [address], [place], [place] [address]     Studies/Results: No results found.    Assessment/Plan:  INTERVAL HISTORY: stools are becoming formed now   Principal Problem:   Clostridium difficile colitis Active Problems:   Essential hypertension   Mixed hyperlipidemia   PAF (paroxysmal atrial fibrillation) (HCC)   Major depression in full remission (HCC)   Hypokalemia   Gallbladder disease   Normocytic anemia    LETHEA BRANIGAN is a 72 y.o. female with recurrent [name] colitis now improving on Dificid.  She has a 10-day supply of Dificid that will be shipped to her home.  I have arranged hospital follow-up with Dr. Renold Don and we will endeavor to set up a Zinplava infusion as an outpatient.   I have personally spent 36 minutes involved in face-to-face and non-face-to-face activities for this patient on the day of the visit. Professional time spent includes the following activities: Preparing to see the patient (review of tests), Obtaining and/or reviewing separately obtained history (admission/discharge record), Performing a medically appropriate examination and/or evaluation , Ordering medications/tests/procedures, referring and communicating with other health care professionals, Documenting clinical information in the EMR, Independently interpreting results (not separately reported), Communicating results to the patient/family/caregiver, Counseling and educating the patient/family/caregiver and Care coordination (not separately reported).   I will sign off for now, please call with further questions.   LOS: 2 days    Acey Lav 09/13/2023, 10:45 AM

## 2023-09-13 NOTE — Progress Notes (Signed)
[facility] Progress Note Patient: Desiree James WUJ:[id number] DOB: [birth year] DOA: 09/10/2023  DOS: the patient was seen and examined on 09/13/2023  Brief Hospital Course: PMH of HTN, HLD, depression, PAF on Xarelto, SSS S/P permanent pacemaker implant,.  Present to the hospital with complaints of abdominal pain and diarrhea. Found to have [name] colitis.  Assessment and Plan: Recurrent [name] colitis Patient was originally diagnosed with [name] on 08/15/2023.  It looks like she was treated with 10 days of vancomycin.  She completed the course of antibiotics however within a few days after cessation she started developing diarrhea again.  Came back with worsening symptoms. CT scan showed pancolitis.  [name] testing returned positive for [name] antigen and [name] toxin. Patient was started back on vancomycin. ID was consulted. Currently on fidaxomicin. Continues to have diarrhea although improving.   Severe hypokalemia Supplemented.    Normocytic anemia Baseline hemoglobin around 13.6. On admission hemoglobin 10.5. Currently 8.5. Drop in hemoglobin is likely dilutional.  No evidence of overt bleeding. B12 normal.  Iron normal.  Folic acid 17.5.  No further workup.  Gallstones with intra and extrahepatic biliary dilatation which is mild. Seen on the CT scan. Normal bilirubin.  Normal ALP.  Normal AST and ALT. No RUQ tenderness. Initially plan was to do MRCP however patient has a pacemaker. This created a logistical issues since she will have to be transferred to another facility.  After further discussions with gastroenterology appears to be reasonable to proceed with this in the outpatient setting.  MRCP is currently canceled.   Paroxysmal atrial fibrillation Noted to be on labetalol for rate control.  Noted to be on Xarelto for anticoagulation.   Essential hypertension Continue home medications.  Monitor blood pressures.   History  of depression Continue home medication.  Noted to be on buspirone as well as sertraline.   Hyperlipidemia Continue ezetimibe as well as rosuvastatin.   Obesity Body mass index is 36.56 kg/m.  Placing the patient at high risk of poor outcome.   Subjective: 3 bm today, no abdominal pain, no nausea  or vomiting.   Physical Exam: General: in Mild distress, No Rash Cardiovascular: S1 and S2 Present, No Murmur Respiratory: Good respiratory effort, Bilateral Air entry present. No Crackles, No wheezes Abdomen: Bowel Sound present, No tenderness Extremities: trace edema Neuro: Alert and oriented x3, no new focal deficit  Data Reviewed: I have Reviewed nursing notes, Vitals, and Lab results. Since last encounter, pertinent lab results CBC BMP   . I have ordered test including CBC BMP  .  Disposition: Status is: Inpatient Remains inpatient appropriate because: improvement in diarrhea   rivaroxaban (XARELTO) tablet 20 mg Start: 09/11/23 1700 rivaroxaban (XARELTO) tablet 20 mg   Family Communication: none at bedside Level of care: Telemetry   Vitals:   09/12/23 1209 09/12/23 2123 09/13/23 0601 09/13/23 1255  BP: (!) 145/49 108/66 (!) 147/75 (!) 148/71  Pulse: 65 62 64 64  Resp: 18 17 16 17   Temp: 98.2 F (36.8 C) 98 F (36.7 C) (!) 97.4 F (36.3 C) 98 F (36.7 C)  TempSrc: Oral Oral Oral Oral  SpO2: 98% 99% 99% 97%  Weight:      Height:         Author: Lynden Oxford, MD 09/13/2023 7:28 PM  Please look on [email or web address] to find out who is on call.

## 2023-09-13 NOTE — Plan of Care (Signed)

## 2023-09-14 DIAGNOSIS — A0472 Enterocolitis due to Clostridium difficile, not specified as recurrent: Secondary | ICD-10-CM | POA: Diagnosis not present

## 2023-09-14 LAB — RENAL FUNCTION PANEL
Albumin: 2.5 g/dL — ABNORMAL LOW (ref 3.5–5.0)
Anion gap: 7 (ref 5–15)
BUN: 10 mg/dL (ref 8–23)
CO2: 19 mmol/L — ABNORMAL LOW (ref 22–32)
Calcium: 8.4 mg/dL — ABNORMAL LOW (ref 8.9–10.3)
Chloride: 109 mmol/L (ref 98–111)
Creatinine, Ser: 0.74 mg/dL (ref 0.44–1.00)
GFR, Estimated: >60 mL/min (ref >60)
Glucose, Bld: 91 mg/dL (ref 70–99)
Phosphorus: 3.6 mg/dL (ref 2.5–4.6)
Potassium: 3.7 mmol/L (ref 3.5–5.1)
Sodium: 135 mmol/L (ref 135–145)

## 2023-09-14 LAB — CBC
HCT: 28.3 % — ABNORMAL LOW (ref 36.0–46.0)
Hemoglobin: 8.7 g/dL — ABNORMAL LOW (ref 12.0–15.0)
MCH: 27.9 pg (ref 26.0–34.0)
MCHC: 30.7 g/dL (ref 30.0–36.0)
MCV: 90.7 fL (ref 80.0–100.0)
Platelets: 272 10*3/uL (ref 150–400)
RBC: 3.12 MIL/uL — ABNORMAL LOW (ref 3.87–5.11)
RDW: 14.9 % (ref 11.5–15.5)
WBC: 8.4 10*3/uL (ref 4.0–10.5)
nRBC: 0 % (ref 0.0–0.2)

## 2023-09-14 LAB — MAGNESIUM: Magnesium: 1.8 mg/dL (ref 1.7–2.4)

## 2023-09-14 MED ORDER — FIDAXOMICIN 200 MG PO TABS: 200.0000 mg | ORAL_TABLET | Freq: Two times a day (BID) | ORAL | 0 refills | Status: DC

## 2023-09-14 NOTE — Progress Notes (Signed)
   09/14/23 1257  TOC Brief Assessment  Insurance and Status Reviewed  Patient has primary care physician Yes  Home environment has been reviewed home with spouse  Prior level of function: independent  Prior/Current Home Services No current home services  Social Drivers of Health Review SDOH reviewed no interventions necessary  Readmission risk has been reviewed Yes  Transition of care needs no transition of care needs at this time

## 2023-09-14 NOTE — Progress Notes (Signed)
[facility] Progress Note Patient: Desiree James BJY:[id number] DOB: 08-27-1951 DOA: 09/10/2023  DOS: the patient was seen and examined on 09/14/2023  Brief Hospital Course: PMH of HTN, HLD, depression, PAF on Xarelto, SSS S/P permanent pacemaker implant,.  Present to the hospital with complaints of abdominal pain and diarrhea. Found to have [name] colitis.  Assessment and Plan: Recurrent [name] colitis Patient was originally diagnosed with [name] on 08/15/2023.  It looks like she was treated with 10 days of vancomycin.  She completed the course of antibiotics however within a few days after cessation she started developing diarrhea again.  Came back with worsening symptoms. CT scan showed pancolitis.  [name] testing returned positive for [name] antigen and [name] toxin. Patient was started back on vancomycin. ID was consulted. Currently on fidaxomicin. Continues to have diarrhea although improving. Initial plan was to discharge the patient on 12/20 as patient is medically stable.  Unable to verify that the patient has received her antibiotic that was prescribed earlier at home and therefore we will have to watch her until she receives the medication.   Severe hypokalemia Supplemented.    Normocytic anemia Baseline hemoglobin around 13.6. On admission hemoglobin 10.5. Currently 8.5. Drop in hemoglobin is likely dilutional.  No evidence of overt bleeding. B12 normal.  Iron normal.  Folic acid 17.5.  No further workup.  Gallstones with intra and extrahepatic biliary dilatation which is mild. Seen on the CT scan. Normal bilirubin.  Normal ALP.  Normal AST and ALT. No RUQ tenderness. Initially plan was to do MRCP however patient has a pacemaker. This created a logistical issues since she will have to be transferred to another facility.  After further discussions with gastroenterology appears to be reasonable to proceed with this in the  outpatient setting.  MRCP is currently canceled.   Paroxysmal atrial fibrillation Noted to be on labetalol for rate control.  Noted to be on Xarelto for anticoagulation.   Essential hypertension Continue home medications.  Monitor blood pressures.   History of depression Continue home medication.  Noted to be on buspirone as well as sertraline.   Hyperlipidemia Continue ezetimibe as well as rosuvastatin.   Obesity Body mass index is 36.56 kg/m.  Placing the patient at high risk of poor outcome.   Subjective: No nausea no vomiting.  Abdominal pain.  Diarrhea resolved.  Physical Exam: General: in Mild distress, No Rash Cardiovascular: S1 and S2 Present, No Murmur Respiratory: Good respiratory effort, Bilateral Air entry present. No Crackles, No wheezes Abdomen: Bowel Sound present, No tenderness Extremities: No edema Neuro: Alert and oriented x3, no new focal deficit  Data Reviewed: I have Reviewed nursing notes, Vitals, and Lab results. Since last encounter, pertinent lab results CBC and BMP   .   Disposition: Status is: Inpatient Remains inpatient appropriate because: Awaiting medication availability at home.  rivaroxaban (XARELTO) tablet 20 mg Start: 09/11/23 1700 rivaroxaban (XARELTO) tablet 20 mg   Family Communication: No one at bedside Level of care: Med-Surg   Vitals:   09/13/23 1255 09/13/23 2029 09/14/23 0656 09/14/23 1406  BP: (!) 148/71 133/61 138/69 (!) 147/96  Pulse: 64 65 61 (!) 57  Resp: 17 19 18 17   Temp: 98 F (36.7 C) 98.3 F (36.8 C) 97.9 F (36.6 C) 97.9 F (36.6 C)  TempSrc: Oral Oral Oral Oral  SpO2: 97% 97% 98% 97%  Weight:      Height:         Author: Edsel Petrin  Allena Katz, MD 09/14/2023 7:28 PM  Please look on [email or web address] to find out who is on call.

## 2023-09-14 NOTE — Progress Notes (Signed)
Mobility Specialist - Progress Note   09/14/23 0843  Mobility  Activity Ambulated independently in hallway  Level of Assistance Independent  Assistive Device None  Distance Ambulated (ft) 600 ft  Activity Response Tolerated well  Mobility Referral Yes  Mobility visit 1 Mobility  Mobility Specialist Start Time (ACUTE ONLY) [id number]  Mobility Specialist Stop Time (ACUTE ONLY) 0842  Mobility Specialist Time Calculation (min) (ACUTE ONLY) 10 min   Pt received in bed and agreeable to mobility. No complaints during session. Pt to bed after session with all needs met.    [facility]

## 2023-09-15 DIAGNOSIS — A0472 Enterocolitis due to Clostridium difficile, not specified as recurrent: Secondary | ICD-10-CM | POA: Diagnosis not present

## 2023-09-15 NOTE — Progress Notes (Signed)
[facility] PROGRESS NOTE  Patient: Desiree James [id number]:[id number]   PCP: Adrian Prince, MD DOB: 05-31-1951   DOA: 09/10/2023   DOS: 09/15/2023    Subjective: No acute complaint.  No nausea no vomiting but no diarrhea.  Objective:  Vitals:   09/14/23 2100 09/14/23 2157 09/15/23 0555 09/15/23 1335  BP: (!) 150/66 (!) 128/56 (!) 149/75 (!) 134/40  Pulse: 68 60 63 64  Resp:  17 16 18   Temp:  98.2 F (36.8 C) 97.7 F (36.5 C) 98.4 F (36.9 C)  TempSrc:  Oral Oral Oral  SpO2:  96% 98% 98%  Weight:      Height:       Assessment and plan: [name] colitis. Currently on Dificid. She has not received her medication yet at her home. Will be monitoring her until patient receives the medications at home.  Author: Lynden Oxford, MD [facility] 09/15/2023 6:33 PM   If 7PM-7AM, please contact night-coverage at [email or web address]

## 2023-09-15 NOTE — Progress Notes (Signed)
Mobility Specialist - Progress Note   09/15/23 0858  Mobility  Activity Ambulated independently in hallway  Level of Assistance Independent  Assistive Device None  Distance Ambulated (ft) 500 ft  Activity Response Tolerated well  Mobility Referral Yes  Mobility visit 1 Mobility  Mobility Specialist Start Time (ACUTE ONLY) [id number]  Mobility Specialist Stop Time (ACUTE ONLY) 0857  Mobility Specialist Time Calculation (min) (ACUTE ONLY) 5 min   Pt received in bed and agreeable to mobility. No complaints during session. Pt to bed after session with all needs met.    [facility]

## 2023-09-16 DIAGNOSIS — A0472 Enterocolitis due to Clostridium difficile, not specified as recurrent: Secondary | ICD-10-CM | POA: Diagnosis not present

## 2023-09-16 LAB — CULTURE, BLOOD (ROUTINE X 2)
Culture: NO GROWTH
Culture: NO GROWTH
Special Requests: ADEQUATE
Special Requests: ADEQUATE

## 2023-09-16 MED ORDER — FIDAXOMICIN 200 MG PO TABS: 200.0000 mg | ORAL_TABLET | Freq: Two times a day (BID) | ORAL | Status: AC

## 2023-09-16 NOTE — Progress Notes (Signed)
Mobility Specialist - Progress Note   09/16/23 0933  Mobility  Activity Ambulated independently in hallway  Level of Assistance Independent  Assistive Device None  Distance Ambulated (ft) 600 ft  Activity Response Tolerated well  Mobility Referral Yes  Mobility visit 1 Mobility  Mobility Specialist Start Time (ACUTE ONLY) [id number]  Mobility Specialist Stop Time (ACUTE ONLY) 0933  Mobility Specialist Time Calculation (min) (ACUTE ONLY) 9 min   Pt received in bed and agreeable to mobility. No complaints during session. Pt to bed after session with all needs met.    [facility]

## 2023-09-16 NOTE — Progress Notes (Signed)
Pt was discharged home today. Instructions were reviewed with patient, and questions were answered. Pt was taken to main entrance via wheelchair by NT.  

## 2023-09-18 NOTE — Discharge Summary (Signed)
Physician Discharge Summary   Patient: Desiree James MRN: [id number] DOB: 04-04-1951  Admit date:     09/10/2023  Discharge date: 09/16/2023  Discharge Physician: Lynden Oxford  PCP: Adrian Prince, MD  Recommendations at discharge: Follow-up with PCP. Follow-up with ID   Follow-up Information     Adrian Prince, MD. Schedule an appointment as soon as possible for a visit in 1 week(s).   Specialty: Endocrinology Contact information: [address] [place] [address] [phone]         Raymondo Band, MD. Go to.   Specialty: Infectious Diseases Why: Go for the Appointment As Scheduled Contact information: [address] [place] [address] [phone]                Discharge Diagnoses: Principal Problem:   Clostridium difficile colitis Active Problems:   Essential hypertension   Mixed hyperlipidemia   PAF (paroxysmal atrial fibrillation) (HCC)   Major depression in full remission (HCC)   Hypokalemia   Gallbladder disease   Normocytic anemia  Hospital Course: PMH of HTN, HLD, depression, PAF on Xarelto, SSS S/P permanent pacemaker implant,.  Present to the hospital with complaints of abdominal pain and diarrhea. Found to have [name] colitis.  Assessment and Plan: Recurrent [name] colitis Patient was originally diagnosed with [name] on 08/15/2023.  It looks like she was treated with 10 days of vancomycin.  She completed the course of antibiotics however within a few days after cessation she started developing diarrhea again.  Came back with worsening symptoms. CT scan showed pancolitis.  [name] testing returned positive for [name] antigen and [name] toxin. Patient was started back on vancomycin. ID was consulted. Currently on fidaxomicin. Continues to have diarrhea although improving. Initial plan was to discharge the patient on 12/20 as patient is medically stable.  Unable to verify that the  patient has received her antibiotic that was prescribed earlier at home and therefore we waited until the patient received the medication.   Severe hypokalemia Supplemented.    Normocytic anemia Baseline hemoglobin around 13.6. On admission hemoglobin 10.5. Currently 8.5. Drop in hemoglobin is likely dilutional.  No evidence of overt bleeding. B12 normal.  Iron normal.  Folic acid 17.5.  No further workup.  Gallstones with intra and extrahepatic biliary dilatation which is mild. Seen on the CT scan. Normal bilirubin.  Normal ALP.  Normal AST and ALT. No RUQ tenderness. Initially plan was to do MRCP however patient has a pacemaker. This created a logistical issues since she will have to be transferred to another facility.  After further discussions with gastroenterology appears to be reasonable to proceed with this in the outpatient setting.  MRCP is currently canceled.   Paroxysmal atrial fibrillation Noted to be on labetalol for rate control.  Noted to be on Xarelto for anticoagulation.   Essential hypertension Continue home medications.  Monitor blood pressures.   History of depression Continue home medication.  Noted to be on buspirone as well as sertraline.   Hyperlipidemia Continue ezetimibe as well as rosuvastatin.   Obesity Body mass index is 36.56 kg/m.  Placing the patient at high risk of poor outcome.  Consultants:  GI ID  Procedures performed:  None  DISCHARGE MEDICATION: Allergies as of 09/16/2023       Reactions   Morphine Rash        Medication List     STOP taking these medications    vancomycin 125 MG capsule Commonly  known as: VANCOCIN       TAKE these medications    busPIRone 5 MG tablet Commonly known as: BUSPAR Take 5 mg by mouth 2 (two) times daily.   Complex B-100-Inositol Tbcr Take 1 tablet by mouth daily.   docusate sodium 100 MG capsule Commonly known as: COLACE Take 100 mg by mouth daily.   ezetimibe 10 MG  tablet Commonly known as: ZETIA Take 1 tablet (10 mg total) by mouth daily.   ferrous sulfate 325 (65 FE) MG EC tablet Take 325 mg by mouth daily.   fidaxomicin 200 MG Tabs tablet Commonly known as: DIFICID Take 1 tablet (200 mg total) by mouth 2 (two) times daily for 8 days.   Fish Oil 1200 MG Caps Take 1,200 mg by mouth daily.   labetalol 200 MG tablet Commonly known as: NORMODYNE Take 1 tablet (200 mg total) by mouth 2 (two) times daily.   losartan 100 MG tablet Commonly known as: COZAAR Take 1 tablet (100 mg total) by mouth daily.   methocarbamol 500 MG tablet Commonly known as: ROBAXIN Take 1 tablet (500 mg total) by mouth every 8 (eight) hours as needed for muscle spasms.   Multi Vitamin Tabs Take 1 tablet by mouth daily.   rivaroxaban 20 MG Tabs tablet Commonly known as: XARELTO Take 1 tablet (20 mg total) by mouth daily with supper.   rosuvastatin 10 MG tablet Commonly known as: CRESTOR Take 10 mg by mouth 2 (two) times a week.   sertraline 50 MG tablet Commonly known as: ZOLOFT Take 50 mg by mouth daily.   SUPER B COMPLEX/C PO Take 1 tablet by mouth daily.   SUPER CAL-MAG-D PO Take 1 tablet by mouth daily.   traMADol 50 MG tablet Commonly known as: ULTRAM Take 1 tablet (50 mg total) by mouth every 6 (six) hours as needed.       Disposition: Home Diet recommendation: Cardiac diet  Discharge Exam: Vitals:   09/15/23 1335 09/15/23 2050 09/16/23 0551 09/16/23 1244  BP: (!) 134/40 (!) 185/78 (!) 147/57 (!) 175/77  Pulse: 64 60 64 61  Resp: 18 18 16 16   Temp: 98.4 F (36.9 C) 98.5 F (36.9 C) 98 F (36.7 C) 97.9 F (36.6 C)  TempSrc: Oral Oral Oral Oral  SpO2: 98% 98% 97% 99%  Weight:      Height:       General: Appear in no distress; no visible Abnormal Neck Mass Or lumps, Conjunctiva normal Cardiovascular: S1 and S2 Present, follow-up with ID murmur, Respiratory: good respiratory effort, Bilateral Air entry present and CTA, no Crackles,  no wheezes Abdomen: Bowel Sound present, Non tender  Extremities: no Pedal edema Neurology:.  No focal deficit alert and oriented to time, place, and person  Filed Weights   09/12/23 1200  Weight: 96.6 kg   Condition at discharge: stable  The results of significant diagnostics from this hospitalization (including imaging, microbiology, ancillary and laboratory) are listed below for reference.   Imaging Studies: CT ABDOMEN PELVIS W CONTRAST Result Date: 09/10/2023 CLINICAL DATA:  Diarrhea EXAM: CT ABDOMEN AND PELVIS WITH CONTRAST TECHNIQUE: Multidetector CT imaging of the abdomen and pelvis was performed using the standard protocol following bolus administration of intravenous contrast. RADIATION DOSE REDUCTION: This exam was performed according to the departmental dose-optimization program which includes automated exposure control, adjustment of the mA and/or kV according to patient size and/or use of iterative reconstruction technique. CONTRAST:  OMNIPAQUE IOHEXOL 300 MG/ML  SOLN COMPARISON:  CT  08/22/2023 FINDINGS: Lower chest: Lung bases are clear. Hepatobiliary: Subcentimeter hypodensities within the left hepatic lobe too small to further characterize. Distended gallbladder with stones. Mild intra and extrahepatic biliary dilatation, common bile duct measuring up to 11 mm. Pancreas: Unremarkable. No pancreatic ductal dilatation or surrounding inflammatory changes. Spleen: Normal in size without focal abnormality. Adrenals/Urinary Tract: Adrenal glands are normal. Kidneys show no hydronephrosis. Bilateral renal cysts for which no specific imaging follow-up is recommended. The bladder is unremarkable Stomach/Bowel: The stomach is nonenlarged. No dilated small bowel. Diffuse colon wall thickening and mucosal enhancement consistent with pancolitis. Vascular/Lymphatic: Mild aortic atherosclerosis. No aneurysm. No suspicious lymph nodes. Reproductive: Uterus and bilateral adnexa are unremarkable.  Other: Negative for pelvic effusion or free air. Musculoskeletal: No acute or suspicious osseous abnormality. IMPRESSION: 1. Findings consistent with pancolitis, presumably related to history of C difficile colitis. No perforation or abscess. 2. Distended gallbladder with stones. Mild intra and extrahepatic biliary dilatation, common bile duct measuring up to 11 mm. Correlate with LFTs with follow-up MRCP as indicated. Aortic Atherosclerosis (ICD10-I70.0). Electronically Signed   By: Jasmine Pang M.D.   On: 09/10/2023 23:36   CT ABDOMEN PELVIS W CONTRAST Result Date: 08/19/2023 CLINICAL DATA:  Acute abdominal pain as well as diarrhea and fatigue. EXAM: CT ABDOMEN AND PELVIS WITH CONTRAST TECHNIQUE: Multidetector CT imaging of the abdomen and pelvis was performed using the standard protocol following bolus administration of intravenous contrast. RADIATION DOSE REDUCTION: This exam was performed according to the departmental dose-optimization program which includes automated exposure control, adjustment of the mA and/or kV according to patient size and/or use of iterative reconstruction technique. CONTRAST:  OMNIPAQUE IOHEXOL 300 MG/ML  SOLN COMPARISON:  Lumbar spine radiographs dated 10/13/2022. FINDINGS: Lower chest: No acute abnormality. Hepatobiliary: Two hypoattenuating lesions in the left hepatic lobe measure 0.8 cm (series 2, image 30) and 1.1 cm (series 2, image 26). Gallstones are seen in the gallbladder. No gallbladder wall thickening. The common bile duct measures 1.4 cm in diameter. Pancreas: Unremarkable. No pancreatic ductal dilatation or surrounding inflammatory changes. Spleen: Normal in size without focal abnormality. Adrenals/Urinary Tract: Adrenal glands are unremarkable. Bilateral renal cysts measure up to 3.0 cm on the left. No renal calculi or hydronephrosis. Bladder is unremarkable. Stomach/Bowel: The patient is status post gastric bypass. There is bowel wall thickening involving the  rectum and colon with areas of associated fat stranding. The appendix appears surgically absent. The small bowel appears normal. No evidence of bowel obstruction. Vascular/Lymphatic: Aortic atherosclerosis. No enlarged abdominal or pelvic lymph nodes. Reproductive: Uterus is unremarkable. Other: No abdominal wall hernia or abnormality. No abdominopelvic ascites. Musculoskeletal: Degenerative changes are seen in the spine. IMPRESSION: 1. Bowel wall thickening involving the rectum and colon with areas of associated fat stranding is consistent with proctocolitis. 2. Cholelithiasis without evidence of acute cholecystitis. 3. Dilatation of the common bile duct measuring up to 1.4 cm in diameter. If there is concern for biliary obstruction, MRCP or ERCP could be considered. 4. Two hypoattenuating lesions in the left hepatic lobe are incompletely characterized on this single phase study. If the patient has no known liver disease or known malignancy, these are likely benign, however can be further evaluated with MRI of the abdomen with and without contrast. Aortic Atherosclerosis (ICD10-I70.0). Electronically Signed   By: Romona Curls M.D.   On: 08/19/2023 20:54   DG Chest Portable 1 View Result Date: 08/19/2023 CLINICAL DATA:  Shortness of breath EXAM: PORTABLE CHEST 1 VIEW COMPARISON:  10/13/2022 FINDINGS: Left-sided  pacing device as before. Upper normal cardiac size. No acute airspace disease, pleural effusion or pneumothorax. Multiple clips in the GE junction region. IMPRESSION: No active disease. Electronically Signed   By: Jasmine Pang M.D.   On: 08/19/2023 17:52    Microbiology: Results for orders placed or performed during the hospital encounter of 09/10/23  Blood culture (routine x 2)     Status: None   Collection Time: 09/11/23 12:06 AM   Specimen: BLOOD RIGHT ARM  Result Value Ref Range Status   Specimen Description BLOOD RIGHT ARM  Final   Special Requests   Final    BOTTLES DRAWN AEROBIC AND  ANAEROBIC Blood Culture adequate volume   Culture   Final    NO GROWTH 5 DAYS Performed at [facility], [address] [place], [place] [address]    Report Status 09/16/2023 FINAL  Final  Blood culture (routine x 2)     Status: None   Collection Time: 09/11/23 12:35 AM   Specimen: BLOOD LEFT ARM  Result Value Ref Range Status   Specimen Description BLOOD LEFT ARM  Final   Special Requests   Final    BOTTLES DRAWN AEROBIC AND ANAEROBIC Blood Culture adequate volume   Culture   Final    NO GROWTH 5 DAYS Performed at [facility], [address], [place], [place] [address]    Report Status 09/16/2023 FINAL  Final  Gastrointestinal Panel by PCR , Stool     Status: None   Collection Time: 09/11/23  2:35 PM   Specimen: Stool  Result Value Ref Range Status   Campylobacter species NOT DETECTED NOT DETECTED Final   Plesimonas shigelloides NOT DETECTED NOT DETECTED Final   Salmonella species NOT DETECTED NOT DETECTED Final   Yersinia enterocolitica NOT DETECTED NOT DETECTED Final   Vibrio species NOT DETECTED NOT DETECTED Final   Vibrio cholerae NOT DETECTED NOT DETECTED Final   Enteroaggregative E coli (EAEC) NOT DETECTED NOT DETECTED Final   Enteropathogenic E coli (EPEC) NOT DETECTED NOT DETECTED Final   Enterotoxigenic E coli (ETEC) NOT DETECTED NOT DETECTED Final   Shiga like toxin producing E coli (STEC) NOT DETECTED NOT DETECTED Final   Shigella/Enteroinvasive E coli (EIEC) NOT DETECTED NOT DETECTED Final   Cryptosporidium NOT DETECTED NOT DETECTED Final   Cyclospora cayetanensis NOT DETECTED NOT DETECTED Final   Entamoeba histolytica NOT DETECTED NOT DETECTED Final   Giardia lamblia NOT DETECTED NOT DETECTED Final   Adenovirus F40/41 NOT DETECTED NOT DETECTED Final   Astrovirus NOT DETECTED NOT DETECTED Final   Norovirus GI/GII NOT DETECTED NOT DETECTED Final   Rotavirus A NOT DETECTED NOT DETECTED Final   Sapovirus (I, II, IV, and V) NOT DETECTED NOT DETECTED  Final    Comment: Performed at [facility], [address] [place], [place] [address]  C Difficile Quick Screen w PCR reflex     Status: Abnormal   Collection Time: 09/11/23  2:35 PM   Specimen: Stool  Result Value Ref Range Status   C Diff antigen POSITIVE (A) NEGATIVE Final   C Diff toxin POSITIVE (A) NEGATIVE Final   C Diff interpretation Toxin producing [name] detected.  Final    Comment: CRITICAL RESULT CALLED TO, READ BACK BY AND VERIFIED WITH: Janice Norrie. RN ON 09/11/2023 1535 BY GOLSON M. Performed at [facility], [address], [place], [place] [address]    Labs: CBC: Recent Labs  Lab 09/11/23 1319 09/12/23 0503  09/13/23 0522 09/14/23 0452  WBC 9.4 8.7 7.7 8.4  NEUTROABS 5.8  --   --   --   HGB 10.0* 8.9* 8.5* 8.7*  HCT 30.6* 29.1* 28.0* 28.3*  MCV 86.9 89.5 90.9 90.7  PLT 281 285 273 272   Basic Metabolic Panel: Recent Labs  Lab 09/11/23 1426 09/12/23 0503 09/13/23 0522 09/14/23 0452  NA 136 137 138 135  K 2.9* 3.5 4.3 3.7  CL 107 110 113* 109  CO2 21* 19* 19* 19*  GLUCOSE 87 95 86 91  BUN 17 16 12 10   CREATININE 0.71 0.76 0.74 0.74  CALCIUM 8.5* 8.3* 8.5* 8.4*  MG 1.8  --  2.2 1.8  PHOS  --   --   --  3.6   Liver Function Tests: Recent Labs  Lab 09/11/23 1426 09/12/23 0503 09/13/23 0522 09/14/23 0452  AST 14* 10* 10*  --   ALT 13 11 11   --   ALKPHOS 68 61 57  --   BILITOT 0.6 0.5 0.4  --   PROT 6.2* 5.8* 5.5*  --   ALBUMIN 2.7* 2.5* 2.4* 2.5*   CBG: No results for input(s): "GLUCAP" in the last 168 hours.  Discharge time spent: greater than 30 minutes.  Author: Lynden Oxford, MD  [facility] 09/16/2023

## 2023-09-27 ENCOUNTER — Other Ambulatory Visit: Payer: Self-pay

## 2023-09-27 ENCOUNTER — Telehealth: Payer: Self-pay

## 2023-09-27 ENCOUNTER — Other Ambulatory Visit (HOSPITAL_COMMUNITY): Payer: Self-pay

## 2023-09-27 ENCOUNTER — Ambulatory Visit: Payer: Medicare PPO | Admitting: Internal Medicine

## 2023-09-27 ENCOUNTER — Encounter: Payer: Self-pay | Admitting: Internal Medicine

## 2023-09-27 VITALS — BP 127/85 | HR 64 | Resp 16 | Ht 64.0 in | Wt 204.7 lb

## 2023-09-27 DIAGNOSIS — A498 Other bacterial infections of unspecified site: Secondary | ICD-10-CM | POA: Diagnosis not present

## 2023-09-27 NOTE — Patient Instructions (Signed)
 I am glad your cdiff is better  We'll have to run insurance paperwork and let you know the cost and if you are agreeable will setup the infusion at our short stay unit   No need for any follow up with our clinic at this time

## 2023-09-27 NOTE — Telephone Encounter (Signed)
 [facility] Patient Advocate Encounter   Received notification from [facility] that prior authorization for Vowst is required.   PA submitted on 09/27/23 Key [id number] Status is pending    [facility] will continue to follow.   Arland Hutchinson, CPhT [facility] Patient [facility] for Infectious Disease Phone: [phone] Fax:  [phone]

## 2023-09-27 NOTE — Telephone Encounter (Signed)
 Left vm as patient requested regarding Zinplava Infusion. Advised patient she no longer qualifies and it is not recommended as prevention medication given she was recently treated in the hospital for CDiff. Dr.Vu looked into oral Vowst and even with PA and assistance programs it would be costly to the patient. No further treatment at this time for CDiff.   Advised patient via vm to please call [facility] as soon as she feels symptoms returned as some rebound CDiff is possible. We can then restart the process and she may then qualify for infusion if the time frame supports the need.

## 2023-09-27 NOTE — Progress Notes (Addendum)
 [facility]  Patient Active Problem List   Diagnosis Date Noted   Clostridium difficile colitis 09/11/2023   Pure hypercholesterolemia 09/11/2023   Viral syndrome 09/11/2023   Vitamin D deficiency 09/11/2023   Osteopenia 09/11/2023   Major depression in full remission (HCC) 09/11/2023   Hypokalemia 09/11/2023   Gallbladder disease 09/11/2023   Normocytic anemia 09/11/2023   PAF (paroxysmal atrial fibrillation) (HCC) 10/16/2022   Encounter for care of pacemaker 08/08/2022   Mixed hyperlipidemia 07/17/2022   Sinus node dysfunction (HCC) 06/11/2022   Essential hypertension 06/11/2022   Bradycardia with 31-40 beats per minute 05/09/2022   Retrognathia 05/09/2022   Pacemaker Abbott Assurity Dual chamber Pacemaker 04/03/2022 04/03/2022      Subjective:    Patient ID: Desiree James, female    DOB: 1951-01-30, 74 y.o.   MRN: [id number]  Chief Complaint  Patient presents with   Hospitalization Follow-up    HPI:  Desiree James is a 73 y.o. female here for hospital f/u recurrent cdiff   She was admitted to  09/12/23 for a second episode of symptoms and labs positive (toxin/pcr)  She had taken 10 days fidaxomycin  Today 09/27/23 her sx had resolved She feels well  We discussed plan for zinplava infusion as per dr Fleeta Dam's plan earlier. She said she wants to see how much out of pocket she has to pay and if significant she will defer    Allergies  Allergen Reactions   Morphine Rash      Outpatient Medications Prior to Visit  Medication Sig Dispense Refill   busPIRone  (BUSPAR ) 5 MG tablet Take 5 mg by mouth 2 (two) times daily.     Calcium -Magnesium -Vitamin D (SUPER CAL-MAG-D PO) Take 1 tablet by mouth daily.     docusate sodium (COLACE) 100 MG capsule Take 100 mg by mouth daily.     ezetimibe  (ZETIA ) 10 MG tablet Take 1 tablet (10 mg total) by mouth daily. 90 tablet 3   ferrous sulfate  325 (65 FE) MG EC tablet Take  325 mg by mouth daily.     labetalol  (NORMODYNE ) 200 MG tablet Take 1 tablet (200 mg total) by mouth 2 (two) times daily. 180 tablet 3   losartan  (COZAAR ) 100 MG tablet Take 1 tablet (100 mg total) by mouth daily. 90 tablet 3   methocarbamol  (ROBAXIN ) 500 MG tablet Take 1 tablet (500 mg total) by mouth every 8 (eight) hours as needed for muscle spasms. 20 tablet 0   Multiple Vitamin (MULTI VITAMIN) TABS Take 1 tablet by mouth daily.     Omega-3 Fatty Acids (FISH OIL) 1200 MG CAPS Take 1,200 mg by mouth daily.     rivaroxaban  (XARELTO ) 20 MG TABS tablet Take 1 tablet (20 mg total) by mouth daily with supper. 90 tablet 3   rosuvastatin  (CRESTOR ) 10 MG tablet Take 10 mg by mouth 2 (two) times a week.     sertraline  (ZOLOFT ) 50 MG tablet Take 50 mg by mouth daily.     SUPER B COMPLEX/C PO Take 1 tablet by mouth daily.     traMADol  (ULTRAM ) 50 MG tablet Take 1 tablet (50 mg total) by mouth every 6 (six) hours as needed. 15 tablet 0   Vitamins-Lipotropics (COMPLEX B-100-INOSITOL) TBCR Take 1 tablet by mouth daily.     No facility-administered medications prior to visit.     Social History   Socioeconomic History   Marital status: Married  Spouse name: Not on file   Number of children: 2   Years of education: Not on file   Highest education level: Not on file  Occupational History   Not on file  Tobacco Use   Smoking status: Never   Smokeless tobacco: Never  Vaping Use   Vaping status: Never Used  Substance and Sexual Activity   Alcohol  use: Yes    Comment: occ   Drug use: Never   Sexual activity: Not on file  Other Topics Concern   Not on file  Social History Narrative   Not on file   Social Drivers of Health   Financial Resource Strain: Not on file  Food Insecurity: No Food Insecurity (09/12/2023)   Hunger Vital Sign    Worried About Running Out of Food in the Last Year: Never true    Ran Out of Food in the Last Year: Never true  Transportation Needs: No Transportation  Needs (09/12/2023)   PRAPARE - Administrator, Civil Service (Medical): No    Lack of Transportation (Non-Medical): No  Physical Activity: Not on file  Stress: Not on file  Social Connections: Not on file  Intimate Partner Violence: Not At Risk (09/12/2023)   Humiliation, Afraid, Rape, and Kick questionnaire    Fear of Current or Ex-Partner: No    Emotionally Abused: No    Physically Abused: No    Sexually Abused: No      Review of Systems     Objective:    There were no vitals taken for this visit. Nursing note and vital signs reviewed.  Physical Exam     General/constitutional: no distress, pleasant HEENT: Normocephalic, PER, Conj Clear, EOMI, Oropharynx clear Neck supple CV: rrr no mrg Lungs: clear to auscultation, normal respiratory effort Abd: Soft, Nontender Ext: no edema Skin: No Rash Neuro: nonfocal MSK: no peripheral joint swelling/tenderness/warmth; back spines nontender    Labs: Lab Results  Component Value Date   WBC 8.4 09/14/2023   HGB 8.7 (L) 09/14/2023   HCT 28.3 (L) 09/14/2023   MCV 90.7 09/14/2023   PLT 272 09/14/2023   Last metabolic panel Lab Results  Component Value Date   GLUCOSE 91 09/14/2023   NA 135 09/14/2023   K 3.7 09/14/2023   CL 109 09/14/2023   CO2 19 (L) 09/14/2023   BUN 10 09/14/2023   CREATININE 0.74 09/14/2023   GFRNONAA >60 09/14/2023   CALCIUM  8.4 (L) 09/14/2023   PHOS 3.6 09/14/2023   PROT 5.5 (L) 09/13/2023   ALBUMIN 2.5 (L) 09/14/2023   BILITOT 0.4 09/13/2023   ALKPHOS 57 09/13/2023   AST 10 (L) 09/13/2023   ALT 11 09/13/2023   ANIONGAP 7 09/14/2023    Micro:  Serology:  Imaging:  Assessment & Plan:   Problem List Items Addressed This Visit   None Visit Diagnoses       Recurrent Clostridioides difficile infection    -  Primary         No orders of the defined types were placed in this encounter.    Patient cdiff 2nd episode resolved with dificid  No ppi No current  systemic abx otherwise   Higher relapse rate with each episode discussed   Discussed benefit and data for zinplava. She said she wants to take it if her insurance would cover   We'll run insurance PA and let her know costs and if agreeable we'll help arrange infusion 10 mg/kg zinplava once at our short stay unit  I spend 60 minutes reviewing chart, and calling insurance/short stay and further postvisit    Addendum, post visit we called patient and advise the data do not recommend zinplava to be used outside of cdiff SOC abx treatment.   Follow-up: No follow-ups on file.      Desiree ONEIDA Passer, MD [facility] 09/27/2023, 3:08 PM

## 2023-09-28 ENCOUNTER — Other Ambulatory Visit (HOSPITAL_COMMUNITY): Payer: Self-pay

## 2023-09-28 ENCOUNTER — Telehealth: Payer: Self-pay

## 2023-09-28 NOTE — Telephone Encounter (Signed)
 [facility] Patient Advocate Encounter  Prior Authorization for [employer] has been approved.    PA# [id number] Effective dates: 01/25/ through 09/24/24  Patients co-pay is $2,000.00.   There is patient assistance , will need income information.  [facility] will continue to follow.  Arland Hutchinson, CPhT [facility] [address] [facility] Phone: [phone] Fax:  [phone]

## 2023-10-01 ENCOUNTER — Ambulatory Visit (INDEPENDENT_AMBULATORY_CARE_PROVIDER_SITE_OTHER): Payer: Medicare PPO

## 2023-10-01 DIAGNOSIS — I495 Sick sinus syndrome: Secondary | ICD-10-CM

## 2023-10-01 LAB — CUP PACEART REMOTE DEVICE CHECK
Battery Remaining Longevity: 50 mo
Battery Remaining Percentage: 82 %
Battery Voltage: 3.01 V
Brady Statistic AP VP Percent: 1 %
Brady Statistic AP VS Percent: 97 %
Brady Statistic AS VP Percent: 1 %
Brady Statistic AS VS Percent: 2.2 %
Brady Statistic RA Percent Paced: 97 %
Brady Statistic RV Percent Paced: 1 %
Date Time Interrogation Session: 20250106025342
Lead Channel Impedance Value: 360 Ohm
Lead Channel Impedance Value: 380 Ohm
Lead Channel Pacing Threshold Amplitude: 0.5 V
Lead Channel Pacing Threshold Amplitude: 1 V
Lead Channel Pacing Threshold Pulse Width: 0.5 ms
Lead Channel Pacing Threshold Pulse Width: 0.5 ms
Lead Channel Sensing Intrinsic Amplitude: 2.3 mV
Lead Channel Sensing Intrinsic Amplitude: 2.5 mV
Lead Channel Setting Pacing Amplitude: 3.5 V
Lead Channel Setting Pacing Amplitude: 3.5 V
Lead Channel Setting Pacing Pulse Width: 0.5 ms
Lead Channel Setting Sensing Sensitivity: 0.5 mV
Pulse Gen Model: 2272
Pulse Gen Serial Number: 8099138

## 2023-10-03 DIAGNOSIS — I5189 Other ill-defined heart diseases: Secondary | ICD-10-CM | POA: Diagnosis not present

## 2023-10-03 DIAGNOSIS — I480 Paroxysmal atrial fibrillation: Secondary | ICD-10-CM | POA: Diagnosis not present

## 2023-10-03 DIAGNOSIS — Z980 Intestinal bypass and anastomosis status: Secondary | ICD-10-CM | POA: Diagnosis not present

## 2023-10-03 DIAGNOSIS — I10 Essential (primary) hypertension: Secondary | ICD-10-CM | POA: Diagnosis not present

## 2023-10-03 DIAGNOSIS — D649 Anemia, unspecified: Secondary | ICD-10-CM | POA: Diagnosis not present

## 2023-10-03 DIAGNOSIS — E669 Obesity, unspecified: Secondary | ICD-10-CM | POA: Diagnosis not present

## 2023-10-03 DIAGNOSIS — A0472 Enterocolitis due to Clostridium difficile, not specified as recurrent: Secondary | ICD-10-CM | POA: Diagnosis not present

## 2023-10-03 DIAGNOSIS — F331 Major depressive disorder, recurrent, moderate: Secondary | ICD-10-CM | POA: Diagnosis not present

## 2023-10-03 DIAGNOSIS — E876 Hypokalemia: Secondary | ICD-10-CM | POA: Diagnosis not present

## 2023-11-12 NOTE — Progress Notes (Signed)
 Remote pacemaker transmission.

## 2023-11-22 ENCOUNTER — Ambulatory Visit: Payer: Medicare PPO | Attending: Cardiology | Admitting: Cardiology

## 2023-11-22 ENCOUNTER — Encounter: Payer: Self-pay | Admitting: Cardiology

## 2023-11-22 VITALS — BP 130/90 | HR 68 | Resp 16 | Ht 64.0 in | Wt 201.0 lb

## 2023-11-22 DIAGNOSIS — I10 Essential (primary) hypertension: Secondary | ICD-10-CM | POA: Diagnosis not present

## 2023-11-22 DIAGNOSIS — I480 Paroxysmal atrial fibrillation: Secondary | ICD-10-CM

## 2023-11-22 MED ORDER — LABETALOL HCL 300 MG PO TABS: 300.0000 mg | ORAL_TABLET | Freq: Two times a day (BID) | ORAL | 3 refills | Status: DC

## 2023-11-22 MED ORDER — LOSARTAN POTASSIUM 100 MG PO TABS: 100.0000 mg | ORAL_TABLET | Freq: Every day | ORAL | 3 refills | Status: DC

## 2023-11-22 MED ORDER — EZETIMIBE 10 MG PO TABS: 10.0000 mg | ORAL_TABLET | Freq: Every day | ORAL | 3 refills | Status: DC

## 2023-11-22 NOTE — Progress Notes (Signed)
 Cardiology Office Note:  .   Date:  11/22/2023  ID:  Desiree James Apr 18, 1951, MRN [id number] PCP: Adrian Prince, MD  [facility] Providers Cardiologist:  Truett Mainland, MD PCP: Adrian Prince, MD  Chief Complaint  Patient presents with   PAF (paroxysmal atrial fibrillation   Essential hypertension   Follow-up    6 month      History of Present Illness: Desiree James    Desiree James is a 73 y.o. female with hypertension, hyperlipidemia, sinus node dysfunction- now s/p pacemaker placement   Patient was admitted in 08/2023 with recurrent [name] colitis.  In the past, she was treated with vancomycin, on the recent admission, she was treated with fidaxomicin.  During hospitalization, she also found her severe hypokalemia and normocytic anemia without any overt bleeding.  Diarrhea has nearly resolved.  She denies any cardiac complaints at this time.  Blood pressure elevated today.  Vitals:   11/22/23 1009  BP: (!) 130/90  Pulse: 68  Resp: 16  SpO2: 97%     ROS:  Review of Systems  Cardiovascular:  Negative for chest pain, dyspnea on exertion, leg swelling, palpitations and syncope.     Studies Reviewed: Desiree James   EKG Interpretation Date/Time:  Thursday November 22 2023 10:12:32 EST Ventricular Rate:  61 PR Interval:    QRS Duration:  94 QT Interval:  438 QTC Calculation: 440 R Axis:   -12  Text Interpretation: EKG 11/22/2023: Atrial-paced rhythm Nonspecific ST and T wave abnormality When compared with ECG of 19-Aug-2023 17:34, No significant change was found Confirmed by Truett Mainland [phone]) on 11/22/2023 10:14:08 AM    EKG 11/22/2023: Atrial-paced rhythm Nonspecific ST and T wave abnormality When compared with ECG of 19-Aug-2023 17:34, No significant change was found    Independently interpreted 08/2023: Hb 8.7 Cr 0.7 TSH 2.5  01/2023: Chol 190, TG 91, HDL 50, LDL 122  Remote device monitoring 09/2023: Scheduled remote reviewed. Normal  device function.   165 AMS, AT/AF burden <1%, longest episode 15 min, 50 sec, ventricular rates >110 bpm ~ 50%, on Xarelto per EPIC.  Atrial undersensing noted during AT/AF, sent to triage for review.   Risk Assessment/Calculations:    CHA2DS2-VASc Score = 3  This indicates a 3.2% annual risk of stroke. The patient's score is based upon: CHF History: 0 HTN History: 1 Diabetes History: 0 Stroke History: 0 Vascular Disease History: 0 Age Score: 1 Gender Score: 1     Physical Exam:   Physical Exam Vitals and nursing note reviewed.  Constitutional:      General: She is not in acute distress. Neck:     Vascular: No JVD.  Cardiovascular:     Rate and Rhythm: Normal rate and regular rhythm.     Heart sounds: Normal heart sounds. No murmur heard. Pulmonary:     Effort: Pulmonary effort is normal.     Breath sounds: Normal breath sounds. No wheezing or rales.  Musculoskeletal:     Right lower leg: No edema.     Left lower leg: No edema.      VISIT DIAGNOSES:   ICD-10-CM   1. PAF (paroxysmal atrial fibrillation) (HCC)  I48.0 EKG 12-Lead    CBC    Lipid panel    Lipid panel    2. Essential hypertension  I10 losartan (COZAAR) 100 MG tablet    CBC    Lipid panel    Lipid panel       ASSESSMENT AND PLAN: .  [name] is a 73 y.o. female with hypertension, hyperlipidemia, sinus node dysfunction, s/p pacemaker placement, PAF   Hypertension: Slightly uncontrolled. Continue labetalol 200 mg twice daily, losartan 100 mg daily, refilled today.   Increased labetalol to 300 mg bid.  PAF: <1% Afib burden. CHA2DS2VASc score 3, annual stroke risk 3.2%. Continue Xarelto 20 mg daily.   Sinus node dysfunction: Now s/p pacemaker placement       Meds ordered this encounter  Medications   ezetimibe (ZETIA) 10 MG tablet    Sig: Take 1 tablet (10 mg total) by mouth daily.    Dispense:  90 tablet    Refill:  3   labetalol (NORMODYNE) 300 MG tablet    Sig: Take  1 tablet (300 mg total) by mouth 2 (two) times daily.    Dispense:  180 tablet    Refill:  3   losartan (COZAAR) 100 MG tablet    Sig: Take 1 tablet (100 mg total) by mouth daily.    Dispense:  90 tablet    Refill:  3     F/u in 3 months  Signed, Elder Negus, MD

## 2023-11-22 NOTE — Patient Instructions (Signed)
 Medication Instructions;   Labetolol  300 mg twice a day    *If you need a refill on your cardiac medications before your next appointment, please call your pharmacy*   CBC TODAY  LIPID PANEL IN 3 MONTHS AT ANY [facility]   If you have labs (blood work) drawn today and your tests are completely normal, you will receive your results only by: MyChart Message (if you have MyChart) OR A paper copy in the mail If you have any lab test that is abnormal or we need to change your treatment, we will call you to review the results.   Testing/Procedures:    Follow-Up: At [facility], you and your health needs are our priority.  As part of our continuing mission to provide you with exceptional heart care, we have created designated Provider Care Teams.  These Care Teams include your primary Cardiologist (physician) and Advanced Practice Providers (APPs -  Physician Assistants and Nurse Practitioners) who all work together to provide you with the care you need, when you need it.  We recommend signing up for the patient portal called "MyChart".  Sign up information is provided on this After Visit Summary.  MyChart is used to connect with patients for Virtual Visits (Telemedicine).  Patients are able to view lab/test results, encounter notes, upcoming appointments, etc.  Non-urgent messages can be sent to your provider as well.   To learn more about what you can do with MyChart, go to [email or web address].    Your next appointment:  3 MONTHS WITH DR Rosemary Holms

## 2023-11-23 ENCOUNTER — Encounter: Payer: Self-pay | Admitting: Cardiology

## 2023-11-23 ENCOUNTER — Ambulatory Visit: Payer: Self-pay | Admitting: Cardiology

## 2023-11-23 LAB — CBC
Hematocrit: 37.6 % (ref 34.0–46.6)
Hemoglobin: 11.9 g/dL (ref 11.1–15.9)
MCH: 27.2 pg (ref 26.6–33.0)
MCHC: 31.6 g/dL (ref 31.5–35.7)
MCV: 86 fL (ref 79–97)
Platelets: 268 10*3/uL (ref 150–450)
RBC: 4.37 x10E6/uL (ref 3.77–5.28)
RDW: 14.4 % (ref 11.7–15.4)
WBC: 7.6 10*3/uL (ref 3.4–10.8)

## 2023-11-23 LAB — LIPID PANEL
Chol/HDL Ratio: 2.4 ratio (ref 0.0–4.4)
Cholesterol, Total: 162 mg/dL (ref 100–199)
HDL: 67 mg/dL (ref >39)
LDL Chol Calc (NIH): 81 mg/dL (ref 0–99)
Triglycerides: 76 mg/dL (ref 0–149)
VLDL Cholesterol Cal: 14 mg/dL (ref 5–40)

## 2023-12-31 ENCOUNTER — Ambulatory Visit (INDEPENDENT_AMBULATORY_CARE_PROVIDER_SITE_OTHER): Payer: Medicare PPO

## 2023-12-31 DIAGNOSIS — I495 Sick sinus syndrome: Secondary | ICD-10-CM | POA: Diagnosis not present

## 2024-01-01 LAB — CUP PACEART REMOTE DEVICE CHECK
Battery Remaining Longevity: 49 mo
Battery Remaining Percentage: 78 %
Battery Voltage: 2.99 V
Brady Statistic AP VP Percent: 1 %
Brady Statistic AP VS Percent: 97 %
Brady Statistic AS VP Percent: 1 %
Brady Statistic AS VS Percent: 2 %
Brady Statistic RA Percent Paced: 97 %
Brady Statistic RV Percent Paced: 1 %
Date Time Interrogation Session: 20250407020015
Lead Channel Impedance Value: 380 Ohm
Lead Channel Impedance Value: 430 Ohm
Lead Channel Pacing Threshold Amplitude: 0.5 V
Lead Channel Pacing Threshold Amplitude: 1 V
Lead Channel Pacing Threshold Pulse Width: 0.5 ms
Lead Channel Pacing Threshold Pulse Width: 0.5 ms
Lead Channel Sensing Intrinsic Amplitude: 2.1 mV
Lead Channel Sensing Intrinsic Amplitude: 2.5 mV
Lead Channel Setting Pacing Amplitude: 3.5 V
Lead Channel Setting Pacing Amplitude: 3.5 V
Lead Channel Setting Pacing Pulse Width: 0.5 ms
Lead Channel Setting Sensing Sensitivity: 0.5 mV
Pulse Gen Model: 2272
Pulse Gen Serial Number: 8099138

## 2024-01-02 ENCOUNTER — Encounter: Payer: Self-pay | Admitting: Internal Medicine

## 2024-02-14 DIAGNOSIS — Z1212 Encounter for screening for malignant neoplasm of rectum: Secondary | ICD-10-CM | POA: Diagnosis not present

## 2024-02-14 DIAGNOSIS — I5189 Other ill-defined heart diseases: Secondary | ICD-10-CM | POA: Diagnosis not present

## 2024-02-14 DIAGNOSIS — E7849 Other hyperlipidemia: Secondary | ICD-10-CM | POA: Diagnosis not present

## 2024-02-14 DIAGNOSIS — I119 Hypertensive heart disease without heart failure: Secondary | ICD-10-CM | POA: Diagnosis not present

## 2024-02-14 DIAGNOSIS — D649 Anemia, unspecified: Secondary | ICD-10-CM | POA: Diagnosis not present

## 2024-02-14 DIAGNOSIS — Z1389 Encounter for screening for other disorder: Secondary | ICD-10-CM | POA: Diagnosis not present

## 2024-02-14 NOTE — Addendum Note (Signed)
 Addended by: Edra Govern D on: 02/14/2024 03:46 PM   Modules accepted: Orders

## 2024-02-14 NOTE — Progress Notes (Signed)
 Remote pacemaker transmission.

## 2024-02-21 DIAGNOSIS — A0472 Enterocolitis due to Clostridium difficile, not specified as recurrent: Secondary | ICD-10-CM | POA: Diagnosis not present

## 2024-02-21 DIAGNOSIS — I480 Paroxysmal atrial fibrillation: Secondary | ICD-10-CM | POA: Diagnosis not present

## 2024-02-21 DIAGNOSIS — I119 Hypertensive heart disease without heart failure: Secondary | ICD-10-CM | POA: Diagnosis not present

## 2024-02-21 DIAGNOSIS — Z0000 Encounter for general adult medical examination without abnormal findings: Secondary | ICD-10-CM | POA: Diagnosis not present

## 2024-02-21 DIAGNOSIS — Z1339 Encounter for screening examination for other mental health and behavioral disorders: Secondary | ICD-10-CM | POA: Diagnosis not present

## 2024-02-21 DIAGNOSIS — Z1212 Encounter for screening for malignant neoplasm of rectum: Secondary | ICD-10-CM | POA: Diagnosis not present

## 2024-02-21 DIAGNOSIS — Z1331 Encounter for screening for depression: Secondary | ICD-10-CM | POA: Diagnosis not present

## 2024-02-21 DIAGNOSIS — Z980 Intestinal bypass and anastomosis status: Secondary | ICD-10-CM | POA: Diagnosis not present

## 2024-02-21 DIAGNOSIS — F418 Other specified anxiety disorders: Secondary | ICD-10-CM | POA: Diagnosis not present

## 2024-02-21 DIAGNOSIS — F331 Major depressive disorder, recurrent, moderate: Secondary | ICD-10-CM | POA: Diagnosis not present

## 2024-02-21 DIAGNOSIS — Z950 Presence of cardiac pacemaker: Secondary | ICD-10-CM | POA: Diagnosis not present

## 2024-02-21 DIAGNOSIS — E669 Obesity, unspecified: Secondary | ICD-10-CM | POA: Diagnosis not present

## 2024-02-21 DIAGNOSIS — K8020 Calculus of gallbladder without cholecystitis without obstruction: Secondary | ICD-10-CM | POA: Diagnosis not present

## 2024-02-21 DIAGNOSIS — R001 Bradycardia, unspecified: Secondary | ICD-10-CM | POA: Diagnosis not present

## 2024-02-21 DIAGNOSIS — R82998 Other abnormal findings in urine: Secondary | ICD-10-CM | POA: Diagnosis not present

## 2024-02-21 DIAGNOSIS — D649 Anemia, unspecified: Secondary | ICD-10-CM | POA: Diagnosis not present

## 2024-02-25 ENCOUNTER — Ambulatory Visit: Payer: Medicare PPO | Attending: Cardiology | Admitting: Cardiology

## 2024-02-25 ENCOUNTER — Encounter: Payer: Self-pay | Admitting: Cardiology

## 2024-02-25 VITALS — BP 115/71 | HR 70 | Ht 64.0 in | Wt 198.2 lb

## 2024-02-25 DIAGNOSIS — I10 Essential (primary) hypertension: Secondary | ICD-10-CM

## 2024-02-25 DIAGNOSIS — E782 Mixed hyperlipidemia: Secondary | ICD-10-CM | POA: Diagnosis not present

## 2024-02-25 DIAGNOSIS — I495 Sick sinus syndrome: Secondary | ICD-10-CM

## 2024-02-25 DIAGNOSIS — I480 Paroxysmal atrial fibrillation: Secondary | ICD-10-CM

## 2024-02-25 NOTE — Patient Instructions (Signed)
 Follow-Up: At [facility], you and your health needs are our priority.  As part of our continuing mission to provide you with exceptional heart care, our providers are all part of one team.  This team includes your primary Cardiologist (physician) and Advanced Practice Providers or APPs (Physician Assistants and Nurse Practitioners) who all work together to provide you with the care you need, when you need it.  Your next appointment:   2 year(s)  Provider:   Cody Das, MD

## 2024-02-25 NOTE — Progress Notes (Signed)
  Cardiology Office Note:  .   Date:  02/25/2024  ID:  [name], DOB 04-14-1951, MRN [id number] PCP: Rosslyn Coons, MD  [facility] Providers Cardiologist:  Fransico Ivy, MD PCP: Rosslyn Coons, MD  Chief Complaint  Patient presents with   Hypertension      History of Present Illness: Desiree James    Desiree James is a 73 y.o. female with hypertension, hyperlipidemia, sinus node dysfunction- now s/p pacemaker placement   Patient is doing well, does not have any cardiac complaints or symptoms at this time.  Blood pressure is not very well-controlled.  Vitals:   02/25/24 1023  BP: 115/71  Pulse: 70      ROS:  Review of Systems  Cardiovascular:  Negative for chest pain, dyspnea on exertion, leg swelling, palpitations and syncope.     Studies Reviewed: Desiree James        EKG 11/22/2023: Atrial-paced rhythm Nonspecific ST and T wave abnormality When compared with ECG of 19-Aug-2023 17:34, No significant change was found    Labs 01/2024: Chol 160, TG 59, HDL 56, LDL 92 Hb 11.4 Cr 0.7 TSH 2.5  08/2023: Hb 8.7 Cr 0.7 TSH 2.5  01/2023: Chol 190, TG 91, HDL 50, LDL 122  Remote device monitoring 09/2023: Scheduled remote reviewed. Normal device function.   165 AMS, AT/AF burden <1%, longest episode 15 min, 50 sec, ventricular rates >110 bpm ~ 50%, on Xarelto  per EPIC.  Atrial undersensing noted during AT/AF, sent to triage for review.   Risk Assessment/Calculations:    CHA2DS2-VASc Score = 3  This indicates a 3.2% annual risk of stroke. The patient's score is based upon: CHF History: 0 HTN History: 1 Diabetes History: 0 Stroke History: 0 Vascular Disease History: 0 Age Score: 1 Gender Score: 1     Physical Exam:   Physical Exam Vitals and nursing note reviewed.  Constitutional:      General: She is not in acute distress. Neck:     Vascular: No JVD.  Cardiovascular:     Rate and Rhythm: Normal rate and regular rhythm.     Heart sounds: Normal  heart sounds. No murmur heard. Pulmonary:     Effort: Pulmonary effort is normal.     Breath sounds: Normal breath sounds. No wheezing or rales.  Musculoskeletal:     Right lower leg: No edema.     Left lower leg: No edema.      VISIT DIAGNOSES:   ICD-10-CM   1. Essential hypertension  I10     2. PAF (paroxysmal atrial fibrillation) (HCC)  I48.0     3. Mixed hyperlipidemia  E78.2     4. Sinus node dysfunction (HCC)  I49.5         ASSESSMENT AND PLAN: .    [name] is a 73 y.o. female with hypertension, hyperlipidemia, sinus node dysfunction, s/p pacemaker placement, PAF   Hypertension: Now very well controlled. Continue labetalol  300 mg twice daily, losartan  100 mg daily.  PAF: <1% Afib burden. CHA2DS2VASc score 3, annual stroke risk 3.2%. Continue Xarelto  20 mg daily.   Sinus node dysfunction: Now s/p pacemaker placement, functioning well. Continue routine pacemaker monitoring through pacemaker clinic  Mixed hyperlipidemia: LDL 92, fairly well-controlled, on Crestor  10 mg twice a week, and Zetia  10 mg daily.  F/u in 2 years  Signed, Cody Das, MD

## 2024-02-29 DIAGNOSIS — K649 Unspecified hemorrhoids: Secondary | ICD-10-CM | POA: Diagnosis not present

## 2024-03-31 ENCOUNTER — Ambulatory Visit (INDEPENDENT_AMBULATORY_CARE_PROVIDER_SITE_OTHER): Payer: Medicare PPO

## 2024-03-31 DIAGNOSIS — I480 Paroxysmal atrial fibrillation: Secondary | ICD-10-CM

## 2024-04-01 LAB — CUP PACEART REMOTE DEVICE CHECK
Battery Remaining Longevity: 46 mo
Battery Remaining Percentage: 75 %
Battery Voltage: 2.99 V
Brady Statistic AP VP Percent: 1.3 %
Brady Statistic AP VS Percent: 97 %
Brady Statistic AS VP Percent: 1 %
Brady Statistic AS VS Percent: 1.7 %
Brady Statistic RA Percent Paced: 97 %
Brady Statistic RV Percent Paced: 1.3 %
Date Time Interrogation Session: 20250707031623
Lead Channel Impedance Value: 360 Ohm
Lead Channel Impedance Value: 440 Ohm
Lead Channel Pacing Threshold Amplitude: 0.5 V
Lead Channel Pacing Threshold Amplitude: 1 V
Lead Channel Pacing Threshold Pulse Width: 0.5 ms
Lead Channel Pacing Threshold Pulse Width: 0.5 ms
Lead Channel Sensing Intrinsic Amplitude: 1.2 mV
Lead Channel Sensing Intrinsic Amplitude: 3.1 mV
Lead Channel Setting Pacing Amplitude: 3.5 V
Lead Channel Setting Pacing Amplitude: 3.5 V
Lead Channel Setting Pacing Pulse Width: 0.5 ms
Lead Channel Setting Sensing Sensitivity: 0.5 mV
Pulse Gen Model: 2272
Pulse Gen Serial Number: 8099138

## 2024-04-03 ENCOUNTER — Ambulatory Visit: Payer: Self-pay | Admitting: Internal Medicine

## 2024-04-12 ENCOUNTER — Other Ambulatory Visit: Payer: Self-pay | Admitting: Cardiology

## 2024-04-23 ENCOUNTER — Other Ambulatory Visit: Payer: Self-pay

## 2024-04-23 DIAGNOSIS — I10 Essential (primary) hypertension: Secondary | ICD-10-CM

## 2024-04-23 DIAGNOSIS — I480 Paroxysmal atrial fibrillation: Secondary | ICD-10-CM

## 2024-04-23 MED ORDER — RIVAROXABAN 20 MG PO TABS: 20.0000 mg | ORAL_TABLET | Freq: Every day | ORAL | 1 refills | Status: AC

## 2024-04-23 MED ORDER — LABETALOL HCL 300 MG PO TABS: 300.0000 mg | ORAL_TABLET | Freq: Two times a day (BID) | ORAL | 3 refills | Status: AC

## 2024-04-23 MED ORDER — LOSARTAN POTASSIUM 100 MG PO TABS: 100.0000 mg | ORAL_TABLET | Freq: Every day | ORAL | 3 refills | Status: AC

## 2024-04-23 MED ORDER — EZETIMIBE 10 MG PO TABS: 10.0000 mg | ORAL_TABLET | Freq: Every day | ORAL | 3 refills | Status: AC

## 2024-04-23 NOTE — Telephone Encounter (Signed)
 Prescription refill request for Xarelto  received.  Indication: Afib  Last office visit: 02/25/24 (Patwardhan)  Weight: 89.9kg Age: 73 Scr: 0.74 (09/14/23)  CrCl: 98.39ml/min  Appropriate dose. Refill sent.

## 2024-06-30 ENCOUNTER — Ambulatory Visit: Payer: Medicare PPO

## 2024-06-30 DIAGNOSIS — I480 Paroxysmal atrial fibrillation: Secondary | ICD-10-CM

## 2024-07-01 LAB — CUP PACEART REMOTE DEVICE CHECK
Battery Remaining Longevity: 43 mo
Battery Remaining Percentage: 71 %
Battery Voltage: 2.99 V
Brady Statistic AP VP Percent: 1.5 %
Brady Statistic AP VS Percent: 97 %
Brady Statistic AS VP Percent: 1 %
Brady Statistic AS VS Percent: 1.5 %
Brady Statistic RA Percent Paced: 98 %
Brady Statistic RV Percent Paced: 1.5 %
Date Time Interrogation Session: 20251006020026
Lead Channel Impedance Value: 380 Ohm
Lead Channel Impedance Value: 430 Ohm
Lead Channel Pacing Threshold Amplitude: 0.5 V
Lead Channel Pacing Threshold Amplitude: 1 V
Lead Channel Pacing Threshold Pulse Width: 0.5 ms
Lead Channel Pacing Threshold Pulse Width: 0.5 ms
Lead Channel Sensing Intrinsic Amplitude: 1.8 mV
Lead Channel Sensing Intrinsic Amplitude: 2.3 mV
Lead Channel Setting Pacing Amplitude: 3.5 V
Lead Channel Setting Pacing Amplitude: 3.5 V
Lead Channel Setting Pacing Pulse Width: 0.5 ms
Lead Channel Setting Sensing Sensitivity: 0.5 mV
Pulse Gen Model: 2272
Pulse Gen Serial Number: 8099138

## 2024-07-01 NOTE — Progress Notes (Signed)
 Remote PPM Transmission

## 2024-07-03 ENCOUNTER — Ambulatory Visit: Payer: Self-pay | Admitting: Internal Medicine

## 2024-07-07 NOTE — Progress Notes (Signed)
 Remote PPM Transmission

## 2024-07-17 DIAGNOSIS — Z23 Encounter for immunization: Secondary | ICD-10-CM | POA: Diagnosis not present

## 2024-09-29 ENCOUNTER — Ambulatory Visit: Payer: Medicare PPO

## 2024-09-29 DIAGNOSIS — I480 Paroxysmal atrial fibrillation: Secondary | ICD-10-CM

## 2024-10-01 LAB — CUP PACEART REMOTE DEVICE CHECK
Battery Remaining Longevity: 40 mo
Battery Remaining Percentage: 67 %
Battery Voltage: 2.99 V
Brady Statistic AP VP Percent: 1.7 %
Brady Statistic AP VS Percent: 97 %
Brady Statistic AS VP Percent: 1 %
Brady Statistic AS VS Percent: 1.3 %
Brady Statistic RA Percent Paced: 98 %
Brady Statistic RV Percent Paced: 1.7 %
Date Time Interrogation Session: 20260105020012
Lead Channel Impedance Value: 360 Ohm
Lead Channel Impedance Value: 360 Ohm
Lead Channel Pacing Threshold Amplitude: 0.5 V
Lead Channel Pacing Threshold Amplitude: 1 V
Lead Channel Pacing Threshold Pulse Width: 0.5 ms
Lead Channel Pacing Threshold Pulse Width: 0.5 ms
Lead Channel Sensing Intrinsic Amplitude: 1.1 mV
Lead Channel Sensing Intrinsic Amplitude: 5.4 mV
Lead Channel Setting Pacing Amplitude: 3.5 V
Lead Channel Setting Pacing Amplitude: 3.5 V
Lead Channel Setting Pacing Pulse Width: 0.5 ms
Lead Channel Setting Sensing Sensitivity: 0.5 mV
Pulse Gen Model: 2272
Pulse Gen Serial Number: 8099138

## 2024-10-02 NOTE — Progress Notes (Signed)
 Remote PPM Transmission

## 2024-10-10 ENCOUNTER — Ambulatory Visit: Payer: Self-pay | Admitting: Cardiovascular Disease
